# Patient Record
Sex: Male | Born: 1955
Health system: Southern US, Community
[De-identification: ages and names within clinical notes are randomized; demographics above are authoritative.]

## PROBLEM LIST (undated history)

## (undated) DIAGNOSIS — M1990 Unspecified osteoarthritis, unspecified site: Secondary | ICD-10-CM

## (undated) DIAGNOSIS — J449 Chronic obstructive pulmonary disease, unspecified: Secondary | ICD-10-CM

## (undated) DIAGNOSIS — T7840XA Allergy, unspecified, initial encounter: Secondary | ICD-10-CM

## (undated) HISTORY — PX: HERNIA REPAIR: SHX51

## (undated) HISTORY — DX: Unspecified osteoarthritis, unspecified site: M19.90

## (undated) HISTORY — PX: TONSILLECTOMY: SUR1361

## (undated) HISTORY — DX: Chronic obstructive pulmonary disease, unspecified: J44.9

## (undated) HISTORY — DX: Allergy, unspecified, initial encounter: T78.40XA

## (undated) HISTORY — PX: OTHER SURGICAL HISTORY: SHX169

## (undated) HISTORY — PX: SHOULDER SURGERY: SHX246

---

## 1998-03-31 ENCOUNTER — Ambulatory Visit (HOSPITAL_BASED_OUTPATIENT_CLINIC_OR_DEPARTMENT_OTHER): Admission: RE | Admit: 1998-03-31 | Discharge: 1998-03-31 | Payer: Self-pay | Admitting: Surgery

## 2005-12-27 ENCOUNTER — Ambulatory Visit: Payer: Self-pay | Admitting: Internal Medicine

## 2006-02-17 ENCOUNTER — Ambulatory Visit: Payer: Self-pay | Admitting: Internal Medicine

## 2006-04-04 ENCOUNTER — Ambulatory Visit: Payer: Self-pay | Admitting: Internal Medicine

## 2006-04-29 ENCOUNTER — Ambulatory Visit: Payer: Self-pay | Admitting: Gastroenterology

## 2006-05-13 ENCOUNTER — Ambulatory Visit: Payer: Self-pay | Admitting: Internal Medicine

## 2006-05-23 ENCOUNTER — Ambulatory Visit: Payer: Self-pay | Admitting: Internal Medicine

## 2006-06-09 ENCOUNTER — Encounter (INDEPENDENT_AMBULATORY_CARE_PROVIDER_SITE_OTHER): Payer: Self-pay | Admitting: Specialist

## 2006-06-09 ENCOUNTER — Ambulatory Visit: Payer: Self-pay | Admitting: Gastroenterology

## 2006-08-02 ENCOUNTER — Ambulatory Visit: Payer: Self-pay | Admitting: Internal Medicine

## 2007-08-01 ENCOUNTER — Telehealth: Payer: Self-pay | Admitting: Internal Medicine

## 2007-08-07 ENCOUNTER — Ambulatory Visit: Payer: Self-pay | Admitting: Internal Medicine

## 2007-08-07 DIAGNOSIS — R042 Hemoptysis: Secondary | ICD-10-CM | POA: Insufficient documentation

## 2007-08-07 DIAGNOSIS — J441 Chronic obstructive pulmonary disease with (acute) exacerbation: Secondary | ICD-10-CM

## 2007-08-07 DIAGNOSIS — R5381 Other malaise: Secondary | ICD-10-CM

## 2007-08-07 DIAGNOSIS — R5383 Other fatigue: Secondary | ICD-10-CM

## 2007-08-07 DIAGNOSIS — J309 Allergic rhinitis, unspecified: Secondary | ICD-10-CM | POA: Insufficient documentation

## 2007-08-07 LAB — CONVERTED CEMR LAB
Basophils Absolute: 0 10*3/uL (ref 0.0–0.1)
CO2: 26 meq/L (ref 19–32)
Creatinine, Ser: 1.3 mg/dL (ref 0.4–1.5)
GFR calc Af Amer: 75 mL/min
GFR calc non Af Amer: 62 mL/min
Glucose, Bld: 89 mg/dL (ref 70–99)
Hemoglobin: 15.5 g/dL (ref 13.0–17.0)
MCV: 91 fL (ref 78.0–100.0)
Monocytes Absolute: 0.8 10*3/uL — ABNORMAL HIGH (ref 0.2–0.7)
Neutro Abs: 6.7 10*3/uL (ref 1.4–7.7)
Platelets: 234 10*3/uL (ref 150–400)
RBC: 4.92 M/uL (ref 4.22–5.81)
RDW: 12.6 % (ref 11.5–14.6)

## 2007-08-09 ENCOUNTER — Ambulatory Visit: Payer: Self-pay | Admitting: Cardiovascular Disease

## 2007-08-14 ENCOUNTER — Telehealth: Payer: Self-pay | Admitting: Internal Medicine

## 2007-08-15 ENCOUNTER — Telehealth: Payer: Self-pay | Admitting: Internal Medicine

## 2007-08-16 ENCOUNTER — Encounter: Payer: Self-pay | Admitting: Internal Medicine

## 2007-08-21 ENCOUNTER — Ambulatory Visit: Payer: Self-pay | Admitting: Internal Medicine

## 2007-08-21 DIAGNOSIS — F329 Major depressive disorder, single episode, unspecified: Secondary | ICD-10-CM

## 2007-08-22 ENCOUNTER — Telehealth: Payer: Self-pay | Admitting: Internal Medicine

## 2007-08-30 ENCOUNTER — Encounter: Payer: Self-pay | Admitting: Internal Medicine

## 2008-01-03 ENCOUNTER — Telehealth: Payer: Self-pay | Admitting: Internal Medicine

## 2008-01-09 ENCOUNTER — Ambulatory Visit: Payer: Self-pay | Admitting: Internal Medicine

## 2008-01-09 DIAGNOSIS — N410 Acute prostatitis: Secondary | ICD-10-CM

## 2008-01-10 ENCOUNTER — Telehealth: Payer: Self-pay | Admitting: Internal Medicine

## 2008-01-29 ENCOUNTER — Telehealth: Payer: Self-pay | Admitting: Internal Medicine

## 2008-01-29 ENCOUNTER — Ambulatory Visit: Payer: Self-pay | Admitting: Internal Medicine

## 2008-07-25 ENCOUNTER — Telehealth: Payer: Self-pay | Admitting: Internal Medicine

## 2008-07-29 ENCOUNTER — Ambulatory Visit: Payer: Self-pay | Admitting: Internal Medicine

## 2008-07-29 DIAGNOSIS — B353 Tinea pedis: Secondary | ICD-10-CM | POA: Insufficient documentation

## 2008-07-29 LAB — CONVERTED CEMR LAB
Alkaline Phosphatase: 72 units/L (ref 39–117)
Blood in Urine, dipstick: NEGATIVE
CO2: 28 meq/L (ref 19–32)
Calcium: 9.8 mg/dL (ref 8.4–10.5)
Creatinine, Ser: 1.1 mg/dL (ref 0.4–1.5)
Eosinophils Relative: 0.9 % (ref 0.0–5.0)
GFR calc Af Amer: 90 mL/min
Glucose, Urine, Semiquant: NEGATIVE
HDL: 65.2 mg/dL (ref >39.0)
Hemoglobin: 15.2 g/dL (ref 13.0–17.0)
Lymphocytes Relative: 19.5 % (ref 12.0–46.0)
Neutro Abs: 3.3 10*3/uL (ref 1.4–7.7)
Potassium: 4.4 meq/L (ref 3.5–5.1)
RBC: 4.91 M/uL (ref 4.22–5.81)
Sodium: 144 meq/L (ref 135–145)
Specific Gravity, Urine: 1.02
TSH: 0.76 microintl units/mL (ref 0.35–5.50)
Total CHOL/HDL Ratio: 2.9
Triglycerides: 76 mg/dL (ref 0–149)
Urobilinogen, UA: 0.2
VLDL: 15 mg/dL (ref 0–40)
WBC Urine, dipstick: NEGATIVE
pH: 5.5

## 2008-07-31 ENCOUNTER — Ambulatory Visit: Payer: Self-pay | Admitting: Internal Medicine

## 2008-07-31 DIAGNOSIS — R197 Diarrhea, unspecified: Secondary | ICD-10-CM

## 2008-07-31 DIAGNOSIS — R1013 Epigastric pain: Secondary | ICD-10-CM

## 2008-08-14 ENCOUNTER — Telehealth: Payer: Self-pay | Admitting: Internal Medicine

## 2009-03-14 ENCOUNTER — Telehealth (INDEPENDENT_AMBULATORY_CARE_PROVIDER_SITE_OTHER): Payer: Self-pay | Admitting: *Deleted

## 2010-07-31 ENCOUNTER — Ambulatory Visit: Payer: Self-pay | Admitting: Family Medicine

## 2010-07-31 DIAGNOSIS — H6120 Impacted cerumen, unspecified ear: Secondary | ICD-10-CM

## 2010-08-03 ENCOUNTER — Ambulatory Visit: Payer: Self-pay | Admitting: Internal Medicine

## 2010-08-03 LAB — CONVERTED CEMR LAB
AST: 20 units/L (ref 0–37)
Alkaline Phosphatase: 72 units/L (ref 39–117)
BUN: 25 mg/dL — ABNORMAL HIGH (ref 6–23)
Basophils Absolute: 0 10*3/uL (ref 0.0–0.1)
Basophils Relative: 0.7 % (ref 0.0–3.0)
CO2: 27 meq/L (ref 19–32)
Calcium: 9.4 mg/dL (ref 8.4–10.5)
Cholesterol: 223 mg/dL — ABNORMAL HIGH (ref 0–200)
Creatinine, Ser: 1.1 mg/dL (ref 0.4–1.5)
GFR calc non Af Amer: 71.1 mL/min (ref >60.00)
HCT: 42.4 % (ref 39.0–52.0)
HDL: 73.8 mg/dL (ref >39.00)
Ketones, urine, test strip: NEGATIVE
MCHC: 34.5 g/dL (ref 30.0–36.0)
MCV: 91.5 fL (ref 78.0–100.0)
Neutro Abs: 4.4 10*3/uL (ref 1.4–7.7)
Neutrophils Relative %: 63.3 % (ref 43.0–77.0)
Nitrite: NEGATIVE
Platelets: 216 10*3/uL (ref 150.0–400.0)
Protein, U semiquant: NEGATIVE
RDW: 12.9 % (ref 11.5–14.6)
Sodium: 141 meq/L (ref 135–145)
Total CHOL/HDL Ratio: 3
Urobilinogen, UA: 0.2
VLDL: 13.6 mg/dL (ref 0.0–40.0)
WBC Urine, dipstick: NEGATIVE
pH: 5.5

## 2010-08-12 ENCOUNTER — Ambulatory Visit: Payer: Self-pay | Admitting: Internal Medicine

## 2010-08-12 DIAGNOSIS — B351 Tinea unguium: Secondary | ICD-10-CM

## 2010-09-13 ENCOUNTER — Encounter: Payer: Self-pay | Admitting: Internal Medicine

## 2010-09-24 NOTE — Assessment & Plan Note (Signed)
Summary: cpx/bmw   Vital Signs:  Patient profile:   55 year old male Height:      73 inches Weight:      197 pounds BMI:     26.08 Temp:     98.2 degrees F oral Pulse rate:   80 / minute Resp:     14 per minute BP sitting:   120 / 70  (left arm)  Vitals Entered By: Willy Eddy, LPN (August 12, 2010 4:55 PM) CC: cpx Is Patient Diabetic? No   Primary Care Provider:  Stacie Glaze MD  CC:  cpx.  History of Present Illness: The pt was asked about all immunizations, health maint. services that are appropriate to their age and was given guidance on diet exercize  and weight management  has noted increased dicolorations and thickening of his nails with a prior hx of fungal nail infections no risk of DM or autoimmune issues  Preventive Screening-Counseling & Management  Alcohol-Tobacco     Smoking Status: quit < 6 months     Year Started: 1991     Year Quit: 2011--september     Passive Smoke Exposure: no     Tobacco Counseling: to remain off tobacco products  Problems Prior to Update: 1)  Dermatophytosis of Nail  (ICD-110.1) 2)  Impacted Cerumen  (ICD-380.4) 3)  Abdominal Pain, Epigastric  (ICD-789.06) 4)  Diarrhea  (ICD-787.91) 5)  Dermatophytosis of Foot  (ICD-110.4) 6)  Preventive Health Care  (ICD-V70.0) 7)  Acute Prostatitis  (ICD-601.0) 8)  Depressive Disorder Not Elsewhere Classified  (ICD-311) 9)  Hemoptysis  (ICD-786.3) 10)  Chronic Obstructive Pulmonary Disease, Acute Exacerbation  (ICD-491.21) 11)  Malaise and Fatigue  (ICD-780.79) 12)  Allergic Rhinitis  (ICD-477.9)  Medications Prior to Update: 1)  Wellbutrin Sr 150 Mg  Tb12 (Bupropion Hcl) .Marland Kitchen.. 1 Two Times A Day Hold 2)  Cialis 5 Mg Tabs (Tadalafil) .... One By Mouth Daily  Current Medications (verified): 1)  Wellbutrin Sr 150 Mg  Tb12 (Bupropion Hcl) .Marland Kitchen.. 1 Two Times A Day Hold 2)  Cialis 5 Mg Tabs (Tadalafil) .... One By Mouth Daily 3)  Terbinafine Hcl 250 Mg Tabs (Terbinafine Hcl) .... One  By Mouth Daily For 90 Days  Allergies (verified): No Known Drug Allergies  Past History:  Family History: Last updated: 08/07/2007 father has cardiomyopathy, colon cancer and lung cancer mother hyperthyroidism.  Social History: Last updated: 08/07/2007 Married Current Smoker  Risk Factors: Smoking Status: quit < 6 months (08/12/2010) Passive Smoke Exposure: no (08/12/2010)  Past medical, surgical, family and social histories (including risk factors) reviewed, and no changes noted (except as noted below).  Past Medical History: Reviewed history from 08/07/2007 and no changes required. Allergic rhinitis  Past Surgical History: Reviewed history from 08/07/2007 and no changes required. knee reconstruction on right knee Inguinal herniorrhaphy  Family History: Reviewed history from 08/07/2007 and no changes required. father has cardiomyopathy, colon cancer and lung cancer mother hyperthyroidism.  Social History: Reviewed history from 08/07/2007 and no changes required. Married Current Smoker Smoking Status:  quit < 6 months  Review of Systems  The patient denies anorexia, fever, weight loss, weight gain, vision loss, decreased hearing, hoarseness, chest pain, syncope, dyspnea on exertion, peripheral edema, prolonged cough, headaches, hemoptysis, abdominal pain, melena, hematochezia, severe indigestion/heartburn, hematuria, incontinence, genital sores, muscle weakness, suspicious skin lesions, transient blindness, difficulty walking, depression, unusual weight change, abnormal bleeding, enlarged lymph nodes, angioedema, breast masses, and testicular masses.    Physical Exam  General:  Well-developed,well-nourished,in no acute distress; alert,appropriate and cooperative throughout examination Head:  normocephalic and no abnormalities palpated.   Eyes:  pupils equal and pupils round.   Ears:  bilateral cerumen impactionsR ear normal and L ear normal.   Nose:  no external  deformity and no nasal discharge.   Mouth:  good dentition and pharynx pink and moist.   Neck:  No deformities, masses, or tenderness noted. Chest Wall:  No deformities, masses, tenderness or gynecomastia noted. Lungs:  normal respiratory effort and no wheezes.   Heart:  normal rate and regular rhythm.   Abdomen:  soft and normal bowel sounds.   Genitalia:  circumcised and no urethral discharge.   Prostate:  no nodules and no asymmetry.   Msk:  decreased ROM and joint tenderness.   Pulses:  R and L carotid,radial,femoral,dorsalis pedis and posterior tibial pulses are full and equal bilaterally Extremities:  No clubbing, cyanosis, edema, or deformity noted with normal full range of motion of all joints.   Neurologic:  No cranial nerve deficits noted. Station and gait are normal. Plantar reflexes are down-going bilaterally. DTRs are symmetrical throughout. Sensory, motor and coordinative functions appear intact. Skin:  turgor normal and no edema.   Cervical Nodes:  No lymphadenopathy noted Axillary Nodes:  No palpable lymphadenopathy Psych:  Cognition and judgment appear intact. Alert and cooperative with normal attention span and concentration. No apparent delusions, illusions, hallucinations   Impression & Recommendations:  Problem # 1:  DERMATOPHYTOSIS OF NAIL (ICD-110.1)  Discussed nail care and medication treatment options.   His updated medication list for this problem includes:    Terbinafine Hcl 250 Mg Tabs (Terbinafine hcl) ..... One by mouth daily for 90 days  Problem # 2:  PREVENTIVE HEALTH CARE (ICD-V70.0) The pt was asked about all immunizations, health maint. services that are appropriate to their age and was given guidance on diet exercize  and weight management  Colonoscopy: Adenomatous Polyp (06/09/2006) Td Booster: Historical (07/26/2003)   Chol: 223 (08/03/2010)   HDL: 73.80 (08/03/2010)   LDL: 112 (07/29/2008)   TG: 68.0 (08/03/2010) TSH: 1.27 (08/03/2010)   PSA:  1.73 (08/03/2010) Next Colonoscopy due:: 06/2011 (07/29/2008)  Discussed using sunscreen, use of alcohol, drug use, self testicular exam, routine dental care, routine eye care, routine physical exam, seat belts, multiple vitamins, osteoporosis prevention, adequate calcium intake in diet, and recommendations for immunizations.  Discussed exercise and checking cholesterol.  Discussed gun safety, safe sex, and contraception. Also recommend checking PSA.  Complete Medication List: 1)  Wellbutrin Sr 150 Mg Tb12 (Bupropion hcl) .Marland Kitchen.. 1 two times a day hold 2)  Cialis 5 Mg Tabs (Tadalafil) .... One by mouth daily 3)  Terbinafine Hcl 250 Mg Tabs (Terbinafine hcl) .... One by mouth daily for 90 days  Patient Instructions: 1)  Please schedule a follow-up appointment in 3 months. 2)  Hepatic Panel prior to visit, ICD-9:995.20 Prescriptions: TERBINAFINE HCL 250 MG TABS (TERBINAFINE HCL) one by mouth daily for 90 days  #90 x 0   Entered and Authorized by:   Stacie Glaze MD   Signed by:   Stacie Glaze MD on 08/12/2010   Method used:   Electronically to        [facility] [address] (retail)       [address]       [place], [place]  [address]       Ph: [phone]       Fax: [phone]   RxID:  [id number]    Orders Added: 1)  Est. Patient 40-64 years [99396] 2)  Est. Patient Level II [78469]

## 2010-09-24 NOTE — Assessment & Plan Note (Signed)
Summary: ear pain/njr   Vital Signs:  Patient profile:   55 year old male Weight:      196 pounds O2 Sat:      97 % Temp:     98.3 degrees F Pulse rate:   86 / minute BP sitting:   120 / 82  (left arm)  Vitals Entered By: Pura Spice, RN (July 31, 2010 9:57 AM) CC: rt ear ache pressure been flying alot    History of Present Illness: Right ear pain for the past several days.  Flies frequently and symptoms seem worse with increased altitude. Prior history of cerumen impactions. No recent nasal congestion symptoms. Denies any dizziness, fever, or chills. No sore throat.  Allergies: No Known Drug Allergies  Past History:  Past Medical History: Last updated: 08/07/2007 Allergic rhinitis PMH reviewed for relevance  Review of Systems  The patient denies anorexia, fever, weight loss, decreased hearing, hoarseness, prolonged cough, and headaches.    Physical Exam  General:  Well-developed,well-nourished,in no acute distress; alert,appropriate and cooperative throughout examination Ears:  bilateral cerumen impactions Mouth:  Oral mucosa and oropharynx without lesions or exudates.  Teeth in good repair. Neck:  No deformities, masses, or tenderness noted. Lungs:  Normal respiratory effort, chest expands symmetrically. Lungs are clear to auscultation, no crackles or wheezes. Heart:  normal rate and regular rhythm.     Impression & Recommendations:  Problem # 1:  IMPACTED CERUMEN (ICD-380.4) removed with irrigation without difficulty.  Symptoms improved.  Complete Medication List: 1)  Wellbutrin Sr 150 Mg Tb12 (Bupropion hcl) .Marland Kitchen.. 1 two times a day hold 2)  Cialis 5 Mg Tabs (Tadalafil) .... One by mouth daily   Orders Added: 1)  Est. Patient Level III [54098]

## 2011-02-06 ENCOUNTER — Other Ambulatory Visit: Payer: Self-pay | Admitting: Internal Medicine

## 2011-02-11 ENCOUNTER — Telehealth: Payer: Self-pay | Admitting: *Deleted

## 2011-02-11 NOTE — Telephone Encounter (Signed)
Pt. Would like to have Wellbutrin prescribed again as he has resumed smoking.

## 2011-02-12 MED ORDER — BUPROPION HCL ER (XL) 150 MG PO TB24: 150.0000 mg | ORAL_TABLET | ORAL | Status: DC

## 2011-02-12 NOTE — Telephone Encounter (Signed)
Notified pt. 

## 2011-02-12 NOTE — Telephone Encounter (Signed)
Per dr Yvonne Kendall have generic wellbutrin xl-24 hr 150 qd #30 with 3 refills

## 2011-04-03 ENCOUNTER — Other Ambulatory Visit: Payer: Self-pay | Admitting: Internal Medicine

## 2011-05-26 ENCOUNTER — Other Ambulatory Visit: Payer: Self-pay | Admitting: Internal Medicine

## 2011-07-02 ENCOUNTER — Encounter: Payer: Self-pay | Admitting: Gastroenterology

## 2011-07-20 ENCOUNTER — Other Ambulatory Visit (INDEPENDENT_AMBULATORY_CARE_PROVIDER_SITE_OTHER): Payer: BC Managed Care – PPO

## 2011-07-20 DIAGNOSIS — Z0000 Encounter for general adult medical examination without abnormal findings: Secondary | ICD-10-CM

## 2011-07-20 LAB — BASIC METABOLIC PANEL
BUN: 20 mg/dL (ref 6–23)
CO2: 27 mEq/L (ref 19–32)
Chloride: 108 mEq/L (ref 96–112)
Creatinine, Ser: 1.1 mg/dL (ref 0.4–1.5)

## 2011-07-20 LAB — CBC WITH DIFFERENTIAL/PLATELET
Basophils Relative: 0.2 % (ref 0.0–3.0)
Eosinophils Absolute: 0.3 10*3/uL (ref 0.0–0.7)
Lymphs Abs: 1.6 10*3/uL (ref 0.7–4.0)
MCHC: 33 g/dL (ref 30.0–36.0)
MCV: 94.1 fl (ref 78.0–100.0)
Monocytes Absolute: 0.7 10*3/uL (ref 0.1–1.0)
Neutrophils Relative %: 72.7 % (ref 43.0–77.0)
Platelets: 228 10*3/uL (ref 150.0–400.0)
RBC: 5.04 Mil/uL (ref 4.22–5.81)

## 2011-07-20 LAB — POCT URINALYSIS DIPSTICK
Blood, UA: NEGATIVE
Ketones, UA: NEGATIVE
Protein, UA: NEGATIVE
Spec Grav, UA: 1.025
pH, UA: 5.5

## 2011-07-20 LAB — HEPATIC FUNCTION PANEL
Albumin: 4.1 g/dL (ref 3.5–5.2)
Bilirubin, Direct: 0.1 mg/dL (ref 0.0–0.3)
Total Protein: 6.6 g/dL (ref 6.0–8.3)

## 2011-07-20 LAB — LIPID PANEL
Cholesterol: 248 mg/dL — ABNORMAL HIGH (ref 0–200)
HDL: 94.8 mg/dL (ref >39.00)
Triglycerides: 58 mg/dL (ref 0.0–149.0)

## 2011-07-20 LAB — LDL CHOLESTEROL, DIRECT: Direct LDL: 136.3 mg/dL

## 2011-08-02 ENCOUNTER — Ambulatory Visit (INDEPENDENT_AMBULATORY_CARE_PROVIDER_SITE_OTHER): Payer: BC Managed Care – PPO | Admitting: Internal Medicine

## 2011-08-02 ENCOUNTER — Encounter: Payer: Self-pay | Admitting: Internal Medicine

## 2011-08-02 VITALS — BP 120/70 | HR 77 | Temp 98.2°F | Resp 16 | Ht 72.0 in | Wt 191.0 lb

## 2011-08-02 DIAGNOSIS — E875 Hyperkalemia: Secondary | ICD-10-CM

## 2011-08-02 DIAGNOSIS — Z0000 Encounter for general adult medical examination without abnormal findings: Secondary | ICD-10-CM

## 2011-08-02 DIAGNOSIS — R972 Elevated prostate specific antigen [PSA]: Secondary | ICD-10-CM

## 2011-08-02 MED ORDER — BUPROPION HCL ER (XL) 300 MG PO TB24: 300.0000 mg | ORAL_TABLET | ORAL | Status: DC

## 2011-08-02 MED ORDER — BUPROPION HCL ER (XL) 150 MG PO TB24: 300.0000 mg | ORAL_TABLET | ORAL | Status: DC

## 2011-08-02 NOTE — Progress Notes (Signed)
Subjective:    Patient ID: Miguel Benjamin, male    DOB: [birth year], 55 y.o.   MRN: [id number]  HPI CPX Patient generally feels well and has been followed for only minimal problems including mild chronic obstructive pulmonary disease due to smoking history And fungal nail disease.  He has had periodic abdominal epigastric discomfort. He is on Wellbutrin 300 mg by mouth daily for anxiety depression   Review of Systems  Constitutional: Negative for fever and fatigue.  HENT: Negative for hearing loss, congestion, neck pain and postnasal drip.   Eyes: Negative for discharge, redness and visual disturbance.  Respiratory: Negative for cough, shortness of breath and wheezing.   Cardiovascular: Negative for leg swelling.  Gastrointestinal: Negative for abdominal pain, constipation and abdominal distention.  Genitourinary: Negative for urgency and frequency.  Musculoskeletal: Negative for joint swelling and arthralgias.  Skin: Negative for color change and rash.  Neurological: Negative for weakness and light-headedness.  Hematological: Negative for adenopathy.  Psychiatric/Behavioral: Negative for behavioral problems.   Past Medical History  Diagnosis Date  . Allergy     History   Social History  . Marital Status: Married    Spouse Name: N/A    Number of Children: N/A  . Years of Education: N/A   Occupational History  . Not on file.   Social History Main Topics  . Smoking status: Current Everyday Smoker  . Smokeless tobacco: Not on file  . Alcohol Use: Not on file  . Drug Use: Not on file  . Sexually Active: Not on file   Other Topics Concern  . Not on file   Social History Narrative  . No narrative on file    Past Surgical History  Procedure Date  . Knee reconstruction on right knee   . Hernia repair     Family History  Problem Relation Age of Onset  . Hyperthyroidism Mother   . Colon cancer Father   . Lung cancer Father   . Cardiomyopathy Father     No  Known Allergies  Current Outpatient Prescriptions on File Prior to Visit  Medication Sig Dispense Refill  . CIALIS 5 MG tablet TAKE 1 TABLET BY MOUTH EVERY DAY  30 tablet  0    BP 120/70  Pulse 77  Temp 98.2 F (36.8 C)  Resp 16  Ht 6' (1.829 m)  Wt 191 lb (86.637 kg)  BMI 25.90 kg/m2       Objective:   Physical Exam  Nursing note and vitals reviewed. Constitutional: He is oriented to person, place, and time. He appears well-developed and well-nourished.  HENT:  Head: Normocephalic and atraumatic.  Eyes: Conjunctivae are normal. Pupils are equal, round, and reactive to light.  Neck: Normal range of motion. Neck supple.  Cardiovascular: Normal rate and regular rhythm.   Pulmonary/Chest: Effort normal and breath sounds normal.  Abdominal: Soft. Bowel sounds are normal.  Genitourinary: Rectum normal and prostate normal.  Musculoskeletal: Normal range of motion.  Neurological: He is alert and oriented to person, place, and time.  Skin: Skin is warm and dry.  Psychiatric: He has a normal mood and affect. His behavior is normal.          Assessment & Plan:   Patient presents for yearly preventative medicine examination.   all immunizations and health maintenance protocols were reviewed with the patient and they are up to date with these protocols.   screening laboratory values were reviewed with the patient including screening of hyperlipidemia PSA renal function  and hepatic function.   There medications past medical history social history problem list and allergies were reviewed in detail. Has fungal toes we discussed the use of vinegar soaks and gave him instructions on how to prepare those.  He has acute prostatitis with elevation of his PSA we will treat him with an antibiotic course and repeat the PSA to ensure that there is no increased velocity.  If the PSA continues to increase a referral for urology and consideration for biopsy would be the next step. Smoking  cessation and counseling given   Goals were established with regard to weight loss exercise diet in compliance with medications

## 2011-08-02 NOTE — Patient Instructions (Signed)
The patient is instructed to continue all medications as prescribed. Schedule followup with check out clerk upon leaving the clinic  

## 2011-08-03 LAB — POTASSIUM: Potassium: 4.4 mEq/L (ref 3.5–5.1)

## 2011-08-03 LAB — PSA, TOTAL AND FREE
PSA, Free Pct: 36 % (ref >25)
PSA: 2.22 ng/mL (ref <=4.00)

## 2011-08-18 ENCOUNTER — Encounter: Payer: Self-pay | Admitting: Internal Medicine

## 2011-08-30 ENCOUNTER — Encounter: Payer: Self-pay | Admitting: Internal Medicine

## 2011-08-30 ENCOUNTER — Ambulatory Visit (INDEPENDENT_AMBULATORY_CARE_PROVIDER_SITE_OTHER): Payer: BC Managed Care – PPO | Admitting: Internal Medicine

## 2011-08-30 ENCOUNTER — Telehealth: Payer: Self-pay | Admitting: *Deleted

## 2011-08-30 ENCOUNTER — Ambulatory Visit
Admission: RE | Admit: 2011-08-30 | Discharge: 2011-08-30 | Disposition: A | Discharge status: Home / Self Care | Payer: BC Managed Care – PPO | Source: Ambulatory Visit | Attending: Internal Medicine | Admitting: Internal Medicine

## 2011-08-30 VITALS — BP 132/84 | Temp 98.1°F | Wt 188.0 lb

## 2011-08-30 DIAGNOSIS — R1031 Right lower quadrant pain: Secondary | ICD-10-CM

## 2011-08-30 LAB — CBC WITH DIFFERENTIAL/PLATELET
Basophils Absolute: 0 10*3/uL (ref 0.0–0.1)
Lymphocytes Relative: 21.6 % (ref 12.0–46.0)
Monocytes Relative: 14.1 % — ABNORMAL HIGH (ref 3.0–12.0)
Platelets: 192 10*3/uL (ref 150.0–400.0)
RDW: 13.4 % (ref 11.5–14.6)

## 2011-08-30 LAB — BASIC METABOLIC PANEL
CO2: 27 mEq/L (ref 19–32)
Calcium: 9.2 mg/dL (ref 8.4–10.5)
Chloride: 105 mEq/L (ref 96–112)
Sodium: 143 mEq/L (ref 135–145)

## 2011-08-30 MED ORDER — AMOXICILLIN-POT CLAVULANATE 875-125 MG PO TABS: 1.0000 | ORAL_TABLET | Freq: Two times a day (BID) | ORAL | Status: DC

## 2011-08-30 MED ORDER — HYDROCODONE-ACETAMINOPHEN 5-500 MG PO TABS: 1.0000 | ORAL_TABLET | Freq: Three times a day (TID) | ORAL | Status: DC | PRN

## 2011-08-30 MED ORDER — IOHEXOL 300 MG/ML  SOLN
100.0000 mL | Freq: Once | INTRAMUSCULAR | Status: AC | PRN
  Administered 2011-08-30: 100 mL via INTRAVENOUS

## 2011-08-30 NOTE — Progress Notes (Signed)
  Subjective:    Patient ID: Miguel Benjamin, male    DOB: Dec 06, 1955, 56 y.o.   MRN: [id number]  HPI 56 year old white male complains of severe right lower quadrant/flank pain for the last 24-48 hours. His symptoms started out as mild yesterday.  When he got up this morning and coughed,  he experienced excruciating right lower quadrant pain. He denies any urinary symptoms and he denies shortness of breath. He has been traveling for business as usual. He did not feel well while he was in [place].  He had a "spell" for 10-15 minutes.  Patient also reports experiencing unexplained night sweats for the last 56 weeks.   Review of Systems  Negative for dysuria or hematuria.  He has chronic intermittent cough.  He is long standing smoker - >30 yrs    Past Medical History  Diagnosis Date  . Allergy     History   Social History  . Marital Status: Married    Spouse Name: N/A    Number of Children: N/A  . Years of Education: N/A   Occupational History  . Not on file.   Social History Main Topics  . Smoking status: Current Everyday Smoker  . Smokeless tobacco: Not on file  . Alcohol Use: Not on file  . Drug Use: Not on file  . Sexually Active: Not on file   Other Topics Concern  . Not on file   Social History Narrative  . No narrative on file    Past Surgical History  Procedure Date  . Knee reconstruction on right knee   . Hernia repair     Family History  Problem Relation Age of Onset  . Hyperthyroidism Mother   . Colon cancer Father   . Lung cancer Father   . Cardiomyopathy Father     No Known Allergies  Current Outpatient Prescriptions on File Prior to Visit  Medication Sig Dispense Refill  . buPROPion (WELLBUTRIN XL) 300 MG 24 hr tablet Take 1 tablet (300 mg total) by mouth every morning.  30 tablet  2    BP 132/84  Temp(Src) 98.1 F (36.7 C) (Oral)  Wt 188 lb (85.276 kg)    Objective:   Physical Exam  Constitutional: He is oriented to person, place,  and time. He appears well-developed and well-nourished.  HENT:  Head: Normocephalic and atraumatic.  Cardiovascular: Normal rate, regular rhythm and normal heart sounds.   Pulmonary/Chest:       Prolonged expiration,  Scattered faint expiratory wheeze  Abdominal: Soft. Bowel sounds are normal.       Significant right flank and RLQ tenderness,  No rebound,  No guarding  Neurological: He is alert and oriented to person, place, and time.  Skin: Skin is warm and dry.  Psychiatric: He has a normal mood and affect. His behavior is normal.       Assessment & Plan:

## 2011-08-30 NOTE — Telephone Encounter (Signed)
Pt calls stating he has extreme pain on right side under ribs that is unbearable when he coughs.  He is concerned about his GB.  Pt had chills this am. No fever, No injury. No N, V, diarrhea.  Reproduces pain by palpation.  No appetite.   Pain started yesterday.

## 2011-08-30 NOTE — Telephone Encounter (Signed)
Per Dr. Lovell Sheehan, work pt in with a MD that has an opening today.

## 2011-08-30 NOTE — Patient Instructions (Signed)
Our office will contact you re:  Blood test and CT of abdomin and pelvis results.

## 2011-08-30 NOTE — Assessment & Plan Note (Addendum)
56 year old male presents with acute right lower abdominal pain.  His symptoms are severe with coughing. He has significant right lower quadrant/flank tenderness. Considering his history of night sweats x2 weeks I am concerned of possible smoldering abdominal infection/abscess. Obtain stat CBCD, BMET and CT of abdomen and pelvis.  Addendum - CT of abd and pelvis suggestive of early appendicitis.  Pt advised to start augmentin 875/125 mg tonight.  He has been taking amoxicillin for dental issue.  His white count and sed rate are normal.  Pt advised to follow up with Dr. Lovell Sheehan within 24 - 48 hrs.  He understands to proceed to ER if his symptoms worsen.

## 2011-08-30 NOTE — Progress Notes (Signed)
Addended by: Simeon Craft on: 08/30/2011 07:14 PM   Modules accepted: Orders

## 2011-08-31 ENCOUNTER — Emergency Department (HOSPITAL_COMMUNITY)
Admission: EM | Admit: 2011-08-31 | Discharge: 2011-08-31 | Disposition: A | Discharge status: Home / Self Care | Payer: BC Managed Care – PPO | Attending: Emergency Medicine | Admitting: Emergency Medicine

## 2011-08-31 ENCOUNTER — Encounter (HOSPITAL_COMMUNITY): Payer: Self-pay | Admitting: *Deleted

## 2011-08-31 ENCOUNTER — Telehealth (INDEPENDENT_AMBULATORY_CARE_PROVIDER_SITE_OTHER): Payer: Self-pay

## 2011-08-31 ENCOUNTER — Telehealth: Payer: Self-pay | Admitting: Family Medicine

## 2011-08-31 DIAGNOSIS — R109 Unspecified abdominal pain: Secondary | ICD-10-CM | POA: Insufficient documentation

## 2011-08-31 DIAGNOSIS — R110 Nausea: Secondary | ICD-10-CM | POA: Insufficient documentation

## 2011-08-31 DIAGNOSIS — M1710 Unilateral primary osteoarthritis, unspecified knee: Secondary | ICD-10-CM

## 2011-08-31 DIAGNOSIS — R10819 Abdominal tenderness, unspecified site: Secondary | ICD-10-CM | POA: Insufficient documentation

## 2011-08-31 DIAGNOSIS — F17200 Nicotine dependence, unspecified, uncomplicated: Secondary | ICD-10-CM | POA: Insufficient documentation

## 2011-08-31 LAB — DIFFERENTIAL
Basophils Relative: 1 % (ref 0–1)
Eosinophils Absolute: 0 10*3/uL (ref 0.0–0.7)
Eosinophils Relative: 1 % (ref 0–5)
Lymphs Abs: 1.1 10*3/uL (ref 0.7–4.0)
Neutrophils Relative %: 61 % (ref 43–77)

## 2011-08-31 LAB — COMPREHENSIVE METABOLIC PANEL
ALT: 20 U/L (ref 0–53)
Albumin: 3.8 g/dL (ref 3.5–5.2)
BUN: 17 mg/dL (ref 6–23)
Calcium: 8.9 mg/dL (ref 8.4–10.5)
GFR calc Af Amer: >90 mL/min (ref >90)
Glucose, Bld: 98 mg/dL (ref 70–99)
Sodium: 137 mEq/L (ref 135–145)
Total Protein: 6.7 g/dL (ref 6.0–8.3)

## 2011-08-31 LAB — CBC
MCH: 30.3 pg (ref 26.0–34.0)
MCHC: 34.5 g/dL (ref 30.0–36.0)
MCV: 87.9 fL (ref 78.0–100.0)
Platelets: 225 10*3/uL (ref 150–400)
RBC: 5.05 MIL/uL (ref 4.22–5.81)

## 2011-08-31 NOTE — ED Notes (Signed)
Pt given discharge info and verb understanding.

## 2011-08-31 NOTE — Telephone Encounter (Signed)
Office Message from Date: 08/30/2011 12:00:00 AM Time of Call: [phone] Faxed To: [facility] CallerSharl Ma Fax Number: [phone] Facility: N/A Patient: Miguel Benjamin, Miguel Benjamin DOB: 04/13/1956 Phone: [phone] Provider: Thomos Lemons Message: Sharl Ma is calling with results for CT ordered by Thomos Lemons. The results are Abnormal on CT Abdomen and pelvis and were read by Dr. Mayford Knife. Regarding Appointment: Appt Date: Appt Time: Unknown Provider: Thomos Lemons Reason: Details: Outcome: Message Taken by: Katherine Mantle, CSR [facility]  [address] [place], [place] [address]  P: [phone]  F: [phone]

## 2011-08-31 NOTE — ED Notes (Signed)
WUJ:[id number]<JY> Expected date:08/31/11<BR> Expected time:12:18 PM<BR> Means of arrival:<BR> Comments:<BR> Dr Michaell Cowing patient, appendicitis

## 2011-08-31 NOTE — ED Notes (Signed)
MD at bedside. 

## 2011-08-31 NOTE — ED Provider Notes (Signed)
History     CSN: [id number]  Arrival date & time 08/31/11  1216   First MD Initiated Contact with Patient 08/31/11 1259      Chief Complaint  Patient presents with  . Abdominal Pain    HPI Patient seen by Dr. Artist Pais for right lower quadrant pain. Reports pain began 2 days ago and has progressively worsened. Pain relieved somewhat is Vicodin. Reports Dr. Artist Pais ordered a CT scan of the abdomen. This indicated he may have an early appendicitis. Patient was advised to go to [facility] for surgery. Patient is a 56 y.o. male presenting with abdominal pain. The history is provided by the patient and medical records.  Abdominal Pain The primary symptoms of the illness include abdominal pain and nausea. The primary symptoms of the illness do not include fever, shortness of breath, vomiting, diarrhea or dysuria. The current episode started 2 days ago. The onset of the illness was gradual. The problem has been gradually worsening.  Symptoms associated with the illness do not include chills, constipation, urgency, hematuria, frequency or back pain.    Past Medical History  Diagnosis Date  . Allergy     Past Surgical History  Procedure Date  . Knee reconstruction on right knee   . Hernia repair   . Shoulder surgery     Family History  Problem Relation Age of Onset  . Hyperthyroidism Mother   . Colon cancer Father   . Lung cancer Father   . Cardiomyopathy Father     History  Substance Use Topics  . Smoking status: Current Everyday Smoker -- 1.0 packs/day for 30 years  . Smokeless tobacco: Never Used  . Alcohol Use: Yes     one drink per night      Review of Systems  Constitutional: Negative for fever and chills.  Respiratory: Negative for shortness of breath.   Cardiovascular: Negative for chest pain.  Gastrointestinal: Positive for nausea and abdominal pain. Negative for vomiting, diarrhea, constipation, blood in stool and rectal pain.  Genitourinary: Negative for dysuria,  urgency, frequency, hematuria, flank pain, discharge, penile pain and testicular pain.  Musculoskeletal: Negative for back pain.  Neurological: Negative for dizziness, weakness, numbness and headaches.  All other systems reviewed and are negative.    Allergies  Review of patient's allergies indicates no known allergies.  Home Medications   Current Outpatient Rx  Name Route Sig Dispense Refill  . AMOXICILLIN-POT CLAVULANATE 875-125 MG PO TABS Oral Take 1 tablet by mouth 2 (two) times daily. He is taking for 10 days. He started on 08/30/11 and has not completed.     . BUPROPION HCL ER (XL) 300 MG PO TB24 Oral Take 300 mg by mouth every morning.      Marland Kitchen HYDROCODONE-ACETAMINOPHEN 5-500 MG PO TABS Oral Take 1 tablet by mouth every 8 (eight) hours as needed. For pain.     Marland Kitchen NAPROXEN SODIUM 220 MG PO TABS Oral Take 220 mg by mouth every 8 (eight) hours as needed. For pain.       BP 127/78  Pulse 85  Temp(Src) 98.1 F (36.7 C) (Oral)  Resp 16  SpO2 97%  Physical Exam  Constitutional: He is oriented to person, place, and time. He appears well-developed and well-nourished.  HENT:  Head: Normocephalic and atraumatic.  Eyes: Conjunctivae are normal. Pupils are equal, round, and reactive to light.  Neck: Normal range of motion. Neck supple.  Cardiovascular: Normal rate, regular rhythm and normal heart sounds.   Pulmonary/Chest: Effort  normal and breath sounds normal.  Abdominal: Soft. Normal appearance and bowel sounds are normal. He exhibits no mass. There is no hepatosplenomegaly. There is tenderness (Right flank. mild). There is no rigidity, no rebound, no guarding, no CVA tenderness, no tenderness at McBurney's point and negative Murphy's sign.    Neurological: He is alert and oriented to person, place, and time.  Skin: Skin is warm and dry. No rash noted. No erythema. No pallor.  Psychiatric: He has a normal mood and affect. His behavior is normal.    ED Course  ProceduresCt Abdomen  Pelvis W Contrast  08/30/2011  *RADIOLOGY REPORT*  Clinical Data: Severe right lower quadrant right flank pain and night sweats for 2 weeks.  CT ABDOMEN AND PELVIS WITH CONTRAST  Technique:  Multidetector CT imaging of the abdomen and pelvis was performed following the standard protocol during bolus administration of intravenous contrast.  Contrast: OMNIPAQUE IOHEXOL 300 MG/ML IV SOLN  Comparison: Chest CT 08/09/2007  Findings: Imaged lung bases are clear.  Two approximately 4 mm low density lesions are seen in the right lobe of the liver.  These are too small to characterize but statistically likely cysts.  The spleen, gallbladder, adrenal glands, pancreas, the right kidney are within normal limits.  There is an exophytic 17 mm cyst extending from the lower pole of the left kidney.  The abdominal aorta is normal in caliber contains scattered atherosclerotic calcification.  The appendix is visualized on images 53-60.  The proximal portion of the appendix is filled with oral contrast and measures 5 mm in caliber (normal).  The distal tip of the appendix does not opacify with contrast and does not have air within its lumen.  There is suggestion of thickening of the distal appendiceal wall.  The distal tip of the appendix measures up to 7.6 mm.  No definite periappendiceal stranding or fluid.  The patient has multiple diverticula of the sigmoid colon.  These do not show any acute inflammatory changes.  Urinary bladder is within normal limits.  Prostate gland normal in size.  There is no lymphadenopathy or ascites.  Negative for free air.  Small bowel loops are within normal limits for caliber.  The stomach is nondistended and unremarkable.  Disc height narrowing at L5-S1 with posterior disc bulge.  Disc height narrowing at L4-L5.  No acute or suspicious bony abnormality.  IMPRESSION:  1. Findings are suspicious for early appendicitis of the distal appendix/appendiceal tip, in this patient with right lower  quadrant pain.  This study is a call report. 2.  Sigmoid colon diverticulosis.  No evidence of acute diverticulitis. 3.  Left renal cyst.  Original Report Authenticated By: Britta Mccreedy, M.D.       MDM   1:39 PM Will CSS PA-C is in the room with the patient.      Thomasene Lot, [place] 08/31/11 1343  5:05 PM Patient has been seen by Will and Dr. gross I feel the patient is a light have acute appendicitis the pain is located right flank/upper quadrant. Reports pain is completely resolved. Patient is afebrile and has a normal white count. Patient had been able to tolerate PO last night with a burrito and today with a nutrigrain bar and crackers. Dr. gross provided the patient with information to followup in the office if needed requested that I discharge patient.   Thomasene Lot, PA-C 08/31/11 1728

## 2011-08-31 NOTE — ED Notes (Signed)
Pt had CT scan yesterday due to RLQ pain. Denies n/v. CT suspicious for early appendicitis, sent here to see on call doc, Gross.

## 2011-08-31 NOTE — H&P (Addendum)
Miguel Benjamin is an 56 y.o. male.   Chief Complaint: Abdominal Pain RLQ Primary Care: Darryll Capers HPI: Patient is a 56 year old gentleman who was in good health until 2 days ago he started having pain in his mid right abdomen. Yesterday morning when he got up he said he had stabbing pain each time he coughed . He took some Aleve and Motrin the pain improved but didn't completely resolve it was worse with coughing he contacted Dr. Lovell Sheehan office and was seen by Dr. Cleatis Polka. He was sent for a CT scan at approximately 5 PM yesterday. He was seen back in the clinic and started on Vicodin and Augmentin 875/125 by mouth twice a day. Our office was contacted this morning and he was directed to the emergency room for further evaluation. CT scan shows the proximal portion the appendix is filled with oral contrast and measured 5 mm in diameter the distal apex did not opacify and there is a suggestion of a thickened appendiceal wall the distal tip of the appendix measured 7.6 mm. There is no definite stranding or fluid collection. The sigmoid colon showed multiple diverticula but no inflammatory changes. There was concern that this might be early appendicitis.  Since last night he went home and a burrito. He denies any problems with nausea vomiting diarrhea or constipation no blood in his stool. His pain has improved, he still has discomfort when he coughs, his major source of pain is in the right upper lateral portion of his abdomen almost under his rib cage.  Past Medical History  Diagnosis Date  . Allergy    osteoarthritis involving both knees. Ongoing tobacco use.  Past Surgical History  Procedure Date  . Knee reconstruction on right knee   . Hernia repair   . Shoulder surgery    L right shoulder surgery Dr. Rennis Chris Lasix surgery both eyes. Left inguinal hernia repair 2000.   Family History  Problem Relation Age of Onset  . Hyperthyroidism Mother   . Colon cancer Father   . Lung cancer Father   .  Cardiomyopathy Father    Social History:  reports that he has been smoking.  He has never used smokeless tobacco. He reports that he drinks alcohol. He reports that he does not use illicit drugs. Tobacco one pack per day 30+ year Alcohol approximately 2 ounces per night. Drugs: None Allergies: No Known Allergies  Medications Prior to Admission  Medication Dose Route Frequency Provider Last Rate Last Dose  . iohexol (OMNIPAQUE) 300 MG/ML solution 100 mL  100 mL Intravenous Once PRN Medication Radiologist   100 mL at 08/30/11 1739   No current outpatient prescriptions on file as of 08/31/2011.   home meds: Wellbutrin 300 mg daily. Glucosamine/chondroitin supplement daily. Augmentin 875/125 twice a day he's had 2 doses since yesterday. Vicodin when necessary  Results for orders placed during the hospital encounter of 08/31/11 (from the past 48 hour(s))  CBC     Status: Normal   Collection Time   08/31/11 12:50 PM      Component Value Range Comment   WBC 4.8  4.0 - 10.5 (K/uL)    RBC 5.05  4.22 - 5.81 (MIL/uL)    Hemoglobin 15.3  13.0 - 17.0 (g/dL)    HCT 16.1  09.6 - 04.5 (%)    MCV 87.9  78.0 - 100.0 (fL)    MCH 30.3  26.0 - 34.0 (pg)    MCHC 34.5  30.0 - 36.0 (g/dL)  RDW 12.9  11.5 - 15.5 (%)    Platelets 225  150 - 400 (K/uL)   DIFFERENTIAL     Status: Abnormal   Collection Time   08/31/11 12:50 PM      Component Value Range Comment   Neutrophils Relative 61  43 - 77 (%)    Neutro Abs 3.0  1.7 - 7.7 (K/uL)    Lymphocytes Relative 23  12 - 46 (%)    Lymphs Abs 1.1  0.7 - 4.0 (K/uL)    Monocytes Relative 15 (*) 3 - 12 (%)    Monocytes Absolute 0.7  0.1 - 1.0 (K/uL)    Eosinophils Relative 1  0 - 5 (%)    Eosinophils Absolute 0.0  0.0 - 0.7 (K/uL)    Basophils Relative 1  0 - 1 (%)    Basophils Absolute 0.0  0.0 - 0.1 (K/uL)   COMPREHENSIVE METABOLIC PANEL     Status: Abnormal   Collection Time   08/31/11 12:50 PM      Component Value Range Comment   Sodium 137  135 - 145  (mEq/L)    Potassium 4.2  3.5 - 5.1 (mEq/L)    Chloride 102  96 - 112 (mEq/L)    CO2 23  19 - 32 (mEq/L)    Glucose, Bld 98  70 - 99 (mg/dL)    BUN 17  6 - 23 (mg/dL)    Creatinine, Ser 4.09  0.50 - 1.35 (mg/dL)    Calcium 8.9  8.4 - 10.5 (mg/dL)    Total Protein 6.7  6.0 - 8.3 (g/dL)    Albumin 3.8  3.5 - 5.2 (g/dL)    AST 20  0 - 37 (U/L)    ALT 20  0 - 53 (U/L)    Alkaline Phosphatase 78  39 - 117 (U/L)    Total Bilirubin 0.2 (*) 0.3 - 1.2 (mg/dL)    GFR calc non Af Amer 83 (*) >90 (mL/min)    GFR calc Af Amer >90  >90 (mL/min)    Ct Abdomen Pelvis W Contrast  08/30/2011  *RADIOLOGY REPORT*  Clinical Data: Severe right lower quadrant right flank pain and night sweats for 2 weeks.  CT ABDOMEN AND PELVIS WITH CONTRAST  Technique:  Multidetector CT imaging of the abdomen and pelvis was performed following the standard protocol during bolus administration of intravenous contrast.  Contrast: OMNIPAQUE IOHEXOL 300 MG/ML IV SOLN  Comparison: Chest CT 08/09/2007  Findings: Imaged lung bases are clear.  Two approximately 4 mm low density lesions are seen in the right lobe of the liver.  These are too small to characterize but statistically likely cysts.  The spleen, gallbladder, adrenal glands, pancreas, the right kidney are within normal limits.  There is an exophytic 17 mm cyst extending from the lower pole of the left kidney.  The abdominal aorta is normal in caliber contains scattered atherosclerotic calcification.  The appendix is visualized on images 53-60.  The proximal portion of the appendix is filled with oral contrast and measures 5 mm in caliber (normal).  The distal tip of the appendix does not opacify with contrast and does not have air within its lumen.  There is suggestion of thickening of the distal appendiceal wall.  The distal tip of the appendix measures up to 7.6 mm.  No definite periappendiceal stranding or fluid.  The patient has multiple diverticula of the sigmoid colon.   These do not show any acute inflammatory changes.  Urinary  bladder is within normal limits.  Prostate gland normal in size.  There is no lymphadenopathy or ascites.  Negative for free air.  Small bowel loops are within normal limits for caliber.  The stomach is nondistended and unremarkable.  Disc height narrowing at L5-S1 with posterior disc bulge.  Disc height narrowing at L4-L5.  No acute or suspicious bony abnormality.  IMPRESSION:  1. Findings are suspicious for early appendicitis of the distal appendix/appendiceal tip, in this patient with right lower quadrant pain.  This study is a call report. 2.  Sigmoid colon diverticulosis.  No evidence of acute diverticulitis. 3.  Left renal cyst.  Original Report Authenticated By: Britta Mccreedy, M.D.    Review of Systems  Constitutional: Negative for fever, chills and diaphoresis.  HENT: Negative.   Eyes: Negative.   Respiratory: Positive for cough and wheezing.   Cardiovascular: Negative.   Gastrointestinal: Negative for heartburn, nausea, vomiting, abdominal pain (Started 2 days ago right mid abdomen, then got to be a sharpe stabbing pain yesterday AM), diarrhea, constipation, blood in stool and melena.       No personal nor family history of GI/colon cancer, inflammatory bowel disease, irritable bowel syndrome, allergy such as Celiac Sprue, dietary/dairy problems, colitis, ulcers nor gastritis.    No recent sick contacts/gastroenteritis.  No travel outside the country.  No changes in diet.    Genitourinary: Negative.   Musculoskeletal: Positive for back pain (occasional back pains, but very active, within limits of knee.) and joint pain (Has problems with shoulders, R knee with multiple surgeries, L knee needs surgery).  Skin: Negative.   Neurological: Negative.  Negative for weakness.  Endo/Heme/Allergies: Negative.   Psychiatric/Behavioral:       On Wellbutrin to help with smoking cessation.    Blood pressure 127/78, pulse 85, temperature  98.1 F (36.7 C), temperature source Oral, resp. rate 16, SpO2 97.00%. Physical Exam  Constitutional: He is oriented to person, place, and time. He appears well-developed and well-nourished. No distress.  HENT:  Head: Normocephalic and atraumatic.  Nose: Nose normal.  Eyes: Conjunctivae and EOM are normal. Pupils are equal, round, and reactive to light.  Neck: Normal range of motion. Neck supple. No JVD present. No tracheal deviation present. No thyromegaly present.  Cardiovascular: Normal rate, regular rhythm, normal heart sounds and intact distal pulses.   Respiratory: Effort normal. Wheezes: bilateral. He has no rales. He exhibits tenderness (current discomfort most prnounced R lateral abdominal wall almost under rib cage.).  GI: Soft. Bowel sounds are normal. He exhibits no distension and no mass. There is no tenderness (His original complaint was mid right abdomen, then stabbing pain with coughing same area yesterday.  Better now, Complains of pain mostlly RUQ under rib cage.). There is no rigidity, no rebound, no guarding, no CVA tenderness, no tenderness at McBurney's point and negative Murphy's sign. Hernia confirmed negative in the right inguinal area and confirmed negative in the left inguinal area.    Musculoskeletal: Normal range of motion. He exhibits no edema and no tenderness.  Lymphadenopathy:    He has no cervical adenopathy.  Neurological: He is alert and oriented to person, place, and time. He has normal reflexes. No cranial nerve deficit. Coordination normal.  Skin: Skin is warm and dry. No rash noted. No erythema. No pallor.  Psychiatric: He has a normal mood and affect. His behavior is normal. Judgment and thought content normal.     Assessment/Plan See below  Miguel Benjamin 08/31/2011, 2:31 PM  I have re-reviewed  the the patient's records, history, medications, and allergies.  I have re-examined the patient.    General: Pt awake/alert/oriented x4 in no major acute  distress Eyes: PERRL, normal EOM. Neuro: CN II-XII intact w/o focal sensory/motor deficits. Lymph: No head/neck/groin lymphadenopathy Psych:  No delerium/psychosis/paranoia HEENT: Normocephalic, Mucus membranes moist.  No thrush Neck: Supple, No tracheal deviation Chest: No pain w good excursion.  Mod TTP R lateral rib cage over rib to touch & cough CV:  Pulses intact.  Regular rhythm Abdomen: soft, nontender/nondistended.  No incarcerated hernias.  No pain @ McBurney's point.  No Murphy's sign Ext:  SCDs BLE.  No mjr edema.  No cyanosis Skin: No petechiae / purpurae  This seems c/w MS etiology, not GI.  Pt starving, no N/V/C/D.  No anorexia.  No fevers/chills. ?sweats, but energy great.   Started with Bronchitis episode.  Pt much better today vs yest AM.  Rec trial of Heat/ NSAIDs.  We talked to the patient about the dangers of smoking.  We stressed that tobacco use dramatically increases the risk of peri-operative complications such as infection, tissue necrosis leaving to problems with incision/wound and organ healing, heart attack, stroke, DVT, pulmonary embolism, and death.  We noted there are programs in our community to help stop smoking.  The patient is stable.  There is no evidence of peritonitis, acute abdomen, nor shock.  There is no strong evidence of appendicitis, cholecystitis, SBO, incarc hernia, nor other surgical conditions.  There is no need for surgery at the present moment.  F/U CCS PRN    Ardeth Sportsman, M.D., F.A.C.S. Gastrointestinal and Minimally Invasive Surgery [facility] [address] [place], [place] [address] [phone] Main / Paging [phone] Voice Mail

## 2011-08-31 NOTE — ED Notes (Signed)
Pt c/o pain to rlq.

## 2011-08-31 NOTE — Telephone Encounter (Addendum)
Per Dr. Gerrit Friends contact  Dr. Michaell Cowing or P.A on call team at [facility]- have patient report to [facility]- appendicitis- Patient given information - Information given to [employer]. 11:20 message relayed to  with [facility] Engineer, manufacturing systems at [employer]).

## 2011-09-01 NOTE — ED Provider Notes (Signed)
Medical screening examination/treatment/procedure(s) were performed by non-physician practitioner and as supervising physician I was immediately available for consultation/collaboration.   Gerhard Munch, MD 09/01/11 [phone]

## 2011-11-01 ENCOUNTER — Ambulatory Visit: Payer: BC Managed Care – PPO | Admitting: Internal Medicine

## 2011-12-16 ENCOUNTER — Other Ambulatory Visit: Payer: Self-pay | Admitting: Internal Medicine

## 2012-03-15 ENCOUNTER — Other Ambulatory Visit: Payer: Self-pay | Admitting: Internal Medicine

## 2012-05-22 ENCOUNTER — Encounter: Payer: Self-pay | Admitting: Gastroenterology

## 2012-05-29 ENCOUNTER — Other Ambulatory Visit: Payer: Self-pay | Admitting: Internal Medicine

## 2012-06-01 ENCOUNTER — Telehealth: Payer: Self-pay | Admitting: Internal Medicine

## 2012-06-01 NOTE — Telephone Encounter (Signed)
Pt stated cpx does not have to be yr to date per Ins

## 2012-06-01 NOTE — Telephone Encounter (Signed)
Try the after noon of 11-25-please give 2 15 minute slots

## 2012-06-01 NOTE — Telephone Encounter (Signed)
Pt is sch for 07-17-2012 11.30am

## 2012-06-01 NOTE — Telephone Encounter (Signed)
Pt was schd for cpx on 07/24/12, but pcp is out of office and pt has to rsc. Pt has to get cpx done before end of year or pts insurance will go up by $1,000. Pt has been re-sch for 12/30, but if pcp cxs again pt will mess up insurance. Pt needs work in cpx, sooner than 08/21/12. Pt refuses to see NP or other Dr.

## 2012-06-01 NOTE — Telephone Encounter (Addendum)
Pt last cpx was 08-18-2011. Pt is aware cpx will not be yr to date and Ins may not pay

## 2012-06-29 ENCOUNTER — Ambulatory Visit (AMBULATORY_SURGERY_CENTER): Payer: BC Managed Care – PPO | Admitting: *Deleted

## 2012-06-29 ENCOUNTER — Encounter: Payer: Self-pay | Admitting: Gastroenterology

## 2012-06-29 VITALS — Ht 73.0 in | Wt 192.8 lb

## 2012-06-29 DIAGNOSIS — Z1211 Encounter for screening for malignant neoplasm of colon: Secondary | ICD-10-CM

## 2012-06-29 MED ORDER — PEG-KCL-NACL-NASULF-NA ASC-C 100 G PO SOLR: ORAL | Status: DC

## 2012-06-29 NOTE — Progress Notes (Signed)
No allergy to eggs or soy products  His surg path from 2007 shows adenomatous polyps

## 2012-07-05 ENCOUNTER — Telehealth: Payer: Self-pay | Admitting: Gastroenterology

## 2012-07-05 ENCOUNTER — Encounter: Payer: Self-pay | Admitting: *Deleted

## 2012-07-05 NOTE — Telephone Encounter (Signed)
Pt is calling to let us know that he forgot to tell us that he had COPD.  He does not use oxygen and is not having any problems at this time.  I added COPD to pt's medical history.

## 2012-07-10 ENCOUNTER — Other Ambulatory Visit: Payer: BC Managed Care – PPO

## 2012-07-11 ENCOUNTER — Other Ambulatory Visit (INDEPENDENT_AMBULATORY_CARE_PROVIDER_SITE_OTHER): Payer: BC Managed Care – PPO

## 2012-07-11 DIAGNOSIS — Z0000 Encounter for general adult medical examination without abnormal findings: Secondary | ICD-10-CM

## 2012-07-11 LAB — BASIC METABOLIC PANEL
CO2: 28 mEq/L (ref 19–32)
Chloride: 105 mEq/L (ref 96–112)
Glucose, Bld: 99 mg/dL (ref 70–99)
Sodium: 139 mEq/L (ref 135–145)

## 2012-07-11 LAB — POCT URINALYSIS DIPSTICK
Ketones, UA: NEGATIVE
Leukocytes, UA: NEGATIVE
Nitrite, UA: NEGATIVE
Protein, UA: NEGATIVE
pH, UA: 6

## 2012-07-11 LAB — CBC WITH DIFFERENTIAL/PLATELET
Basophils Relative: 0.5 % (ref 0.0–3.0)
Eosinophils Absolute: 0.2 10*3/uL (ref 0.0–0.7)
Hemoglobin: 13.9 g/dL (ref 13.0–17.0)
Lymphs Abs: 1.2 10*3/uL (ref 0.7–4.0)
MCHC: 33.1 g/dL (ref 30.0–36.0)
MCV: 93.4 fl (ref 78.0–100.0)
Monocytes Absolute: 0.6 10*3/uL (ref 0.1–1.0)
Neutro Abs: 5 10*3/uL (ref 1.4–7.7)
RBC: 4.49 Mil/uL (ref 4.22–5.81)

## 2012-07-11 LAB — LIPID PANEL: VLDL: 17 mg/dL (ref 0.0–40.0)

## 2012-07-11 LAB — HEPATIC FUNCTION PANEL
Albumin: 3.9 g/dL (ref 3.5–5.2)
Total Protein: 6.2 g/dL (ref 6.0–8.3)

## 2012-07-11 LAB — TSH: TSH: 1.14 u[IU]/mL (ref 0.35–5.50)

## 2012-07-11 LAB — PSA: PSA: 2.17 ng/mL (ref 0.10–4.00)

## 2012-07-13 ENCOUNTER — Other Ambulatory Visit: Payer: Self-pay | Admitting: *Deleted

## 2012-07-13 MED ORDER — BUPROPION HCL ER (XL) 300 MG PO TB24: 300.0000 mg | ORAL_TABLET | ORAL | Status: DC

## 2012-07-17 ENCOUNTER — Ambulatory Visit (INDEPENDENT_AMBULATORY_CARE_PROVIDER_SITE_OTHER): Payer: BC Managed Care – PPO | Admitting: Internal Medicine

## 2012-07-17 ENCOUNTER — Encounter: Payer: Self-pay | Admitting: Internal Medicine

## 2012-07-17 VITALS — BP 130/80 | HR 80 | Temp 98.2°F | Resp 16 | Ht 73.0 in | Wt 191.0 lb

## 2012-07-17 DIAGNOSIS — N410 Acute prostatitis: Secondary | ICD-10-CM

## 2012-07-17 DIAGNOSIS — Z0000 Encounter for general adult medical examination without abnormal findings: Secondary | ICD-10-CM

## 2012-07-17 MED ORDER — CIPROFLOXACIN HCL 500 MG PO TABS: 500.0000 mg | ORAL_TABLET | Freq: Two times a day (BID) | ORAL | Status: DC

## 2012-07-17 NOTE — Patient Instructions (Signed)
Take all 3 weeks of medication for prostatitis call if the night sweats do not resolve

## 2012-07-17 NOTE — Progress Notes (Signed)
  Subjective:    Patient ID: Miguel Benjamin, male    DOB: Apr 24, 1956, 56 y.o.   MRN: [id number]  HPI Smoking cessation for 2 months Moderate night sweats Normal white counts No sweats with travel Increased urination and hx of prostate infection     Review of Systems  Constitutional: Negative for fever and fatigue.       Night sweats  HENT: Negative for hearing loss, congestion, neck pain and postnasal drip.   Eyes: Negative for discharge, redness and visual disturbance.  Respiratory: Negative for cough, shortness of breath and wheezing.   Cardiovascular: Negative for leg swelling.  Gastrointestinal: Negative for abdominal pain, constipation and abdominal distention.  Genitourinary: Negative for urgency and frequency.  Musculoskeletal: Negative for joint swelling and arthralgias.  Skin: Negative for color change and rash.  Neurological: Negative for weakness and light-headedness.  Hematological: Negative for adenopathy.  Psychiatric/Behavioral: Negative for behavioral problems.       Objective:   Physical Exam  Nursing note and vitals reviewed. Constitutional: He is oriented to person, place, and time. He appears well-developed and well-nourished.  HENT:  Head: Normocephalic and atraumatic.  Eyes: Conjunctivae normal are normal. Pupils are equal, round, and reactive to light.  Neck: Normal range of motion. Neck supple.  Cardiovascular: Normal rate and regular rhythm.   Pulmonary/Chest: Effort normal and breath sounds normal.  Abdominal: Soft. Bowel sounds are normal.  Genitourinary:       Is enlarged and boggy  Musculoskeletal: Normal range of motion.  Neurological: He is alert and oriented to person, place, and time.  Skin: Skin is warm and dry.  Psychiatric: He has a normal mood and affect. His behavior is normal.          Assessment & Plan:  Possible hypoglycemia, probable prostatitis  Patient presents for yearly preventative medicine examination.   all  immunizations and health maintenance protocols were reviewed with the patient and they are up to date with these protocols.   screening laboratory values were reviewed with the patient including screening of hyperlipidemia PSA renal function and hepatic function.   There medications past medical history social history problem list and allergies were reviewed in detail.   Goals were established with regard to weight loss exercise diet in compliance with medications

## 2012-07-22 IMAGING — CT CT ABD-PELV W/ CM
2 of 5 series · 16 of 46 positions shown, 18 images · IV contrast ([ID] OMNI 300)
Comparison: Chest CT 08/09/2007

CLINICAL DATA: Severe right lower quadrant right flank pain and
night sweats for 2 weeks.

CT ABDOMEN AND PELVIS WITH CONTRAST
TECHNIQUE: Multidetector CT imaging of the abdomen and pelvis was
performed following the standard protocol during bolus
administration of intravenous contrast.
Contrast: 100mL OMNIPAQUE IOHEXOL 300 MG/ML IV SOLN

[Series 2: abd/pelvis with · axial · 0.70mm/px · z∈[-390,-30]mm · 13 of 80 slices shown, 15 images]
[im 5/80  soft-tissue]
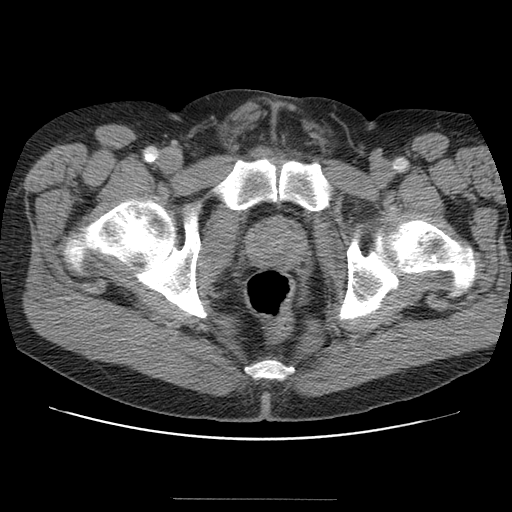
[im 5/80  bone]
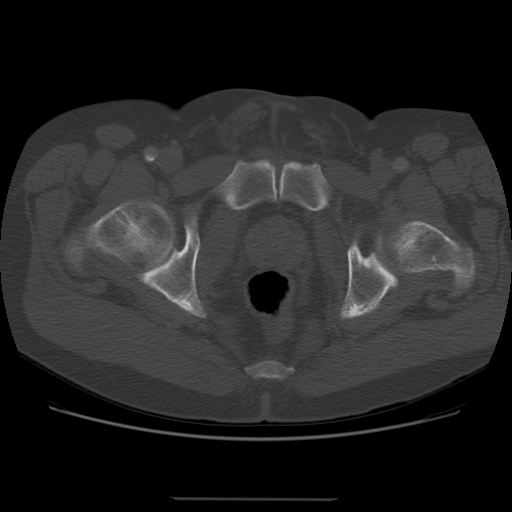
[im 9/80  soft-tissue]
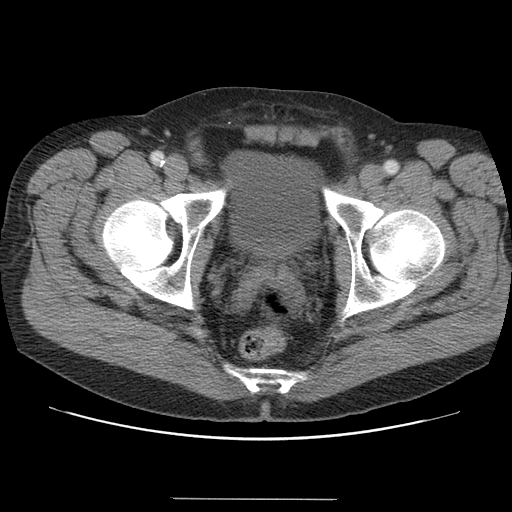
[im 18/80  soft-tissue]
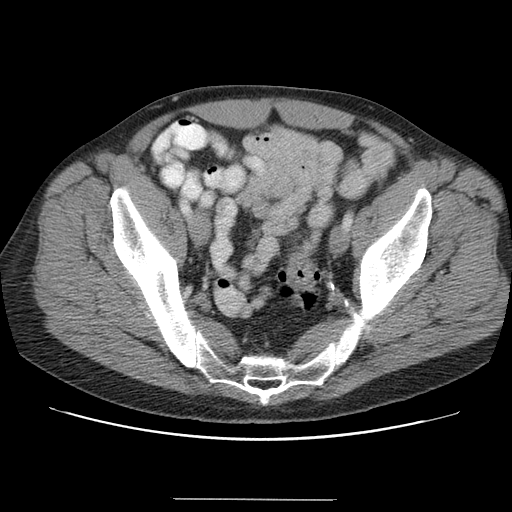
[im 22/80  soft-tissue]
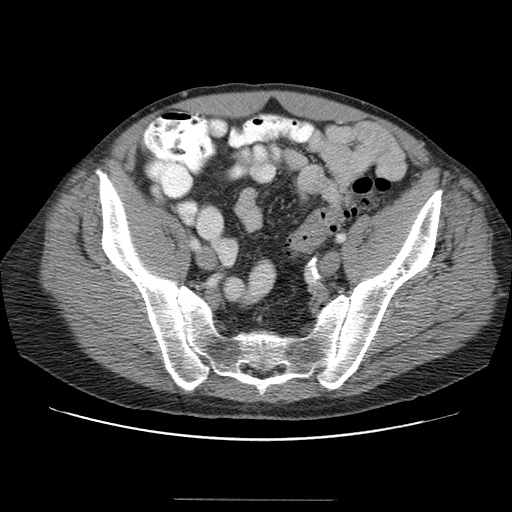
[im 27/80  soft-tissue]
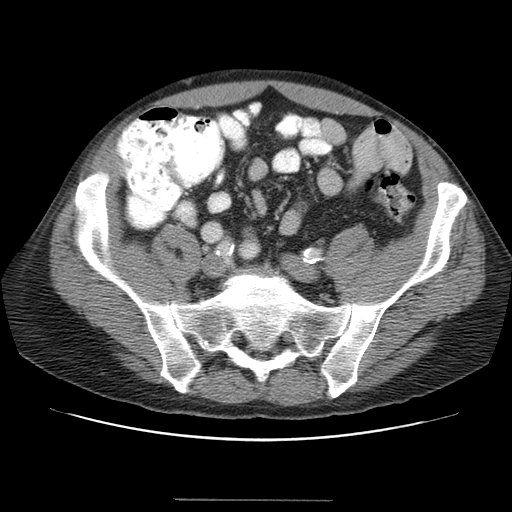
[im 36/80  soft-tissue]
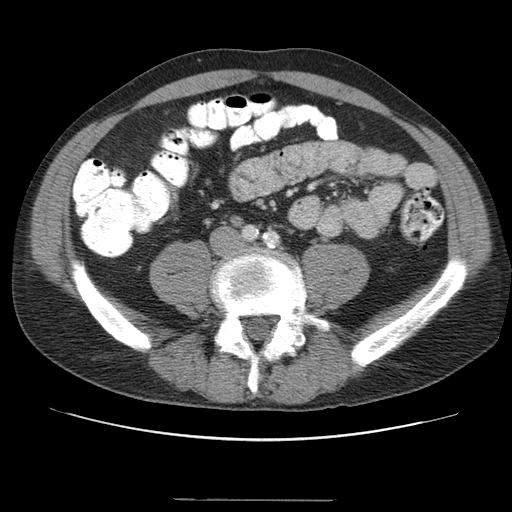
[im 40/80  soft-tissue]
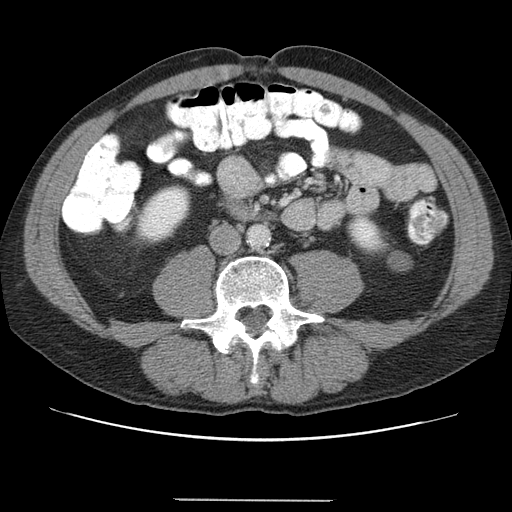
[im 44/80  soft-tissue]
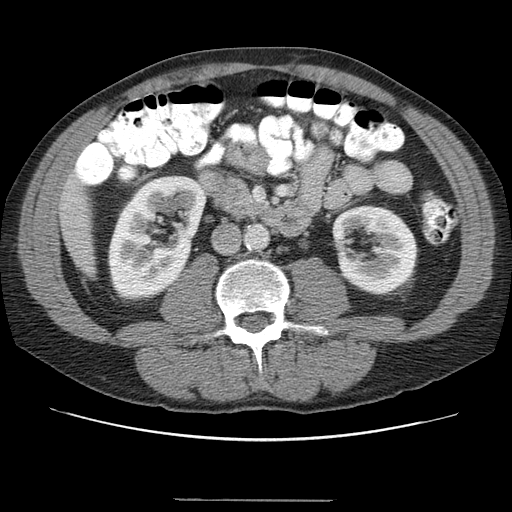
[im 53/80  soft-tissue]
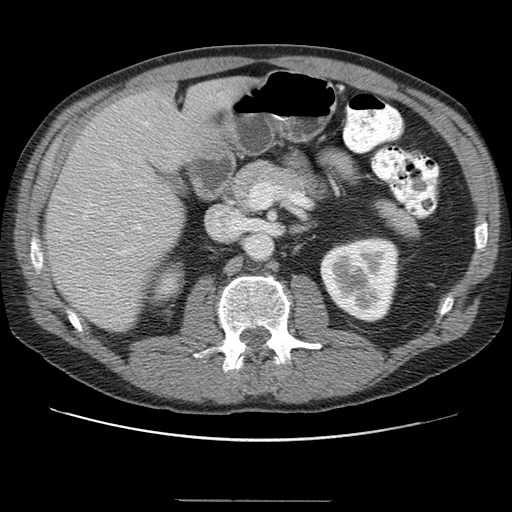
[im 53/80  bone]
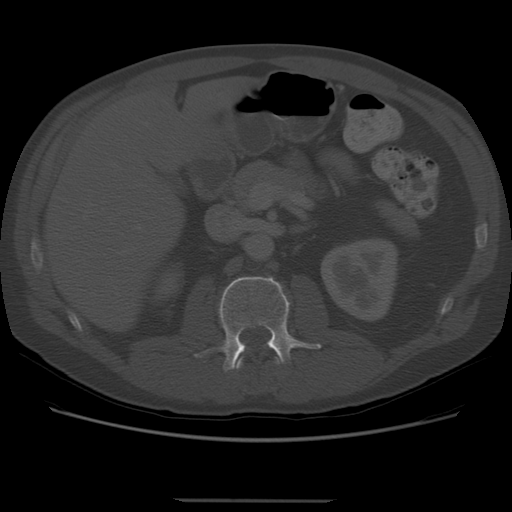
[im 58/80  soft-tissue]
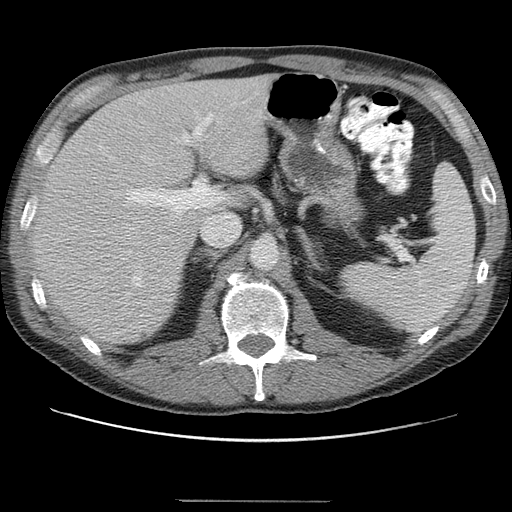
[im 62/80  soft-tissue]
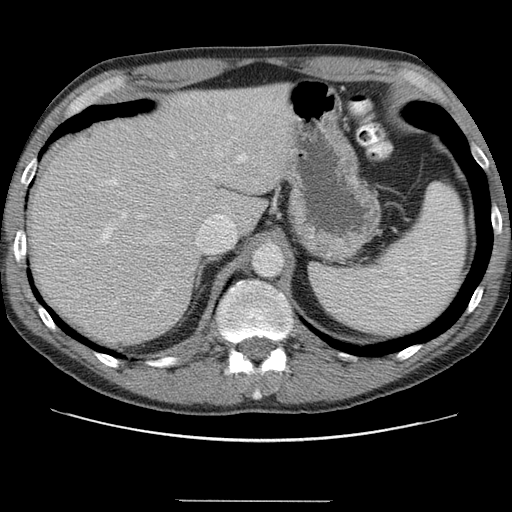
[im 71/80  soft-tissue]
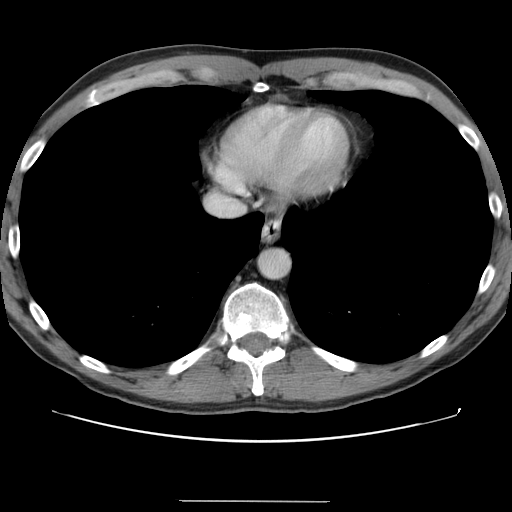
[im 75/80  soft-tissue]
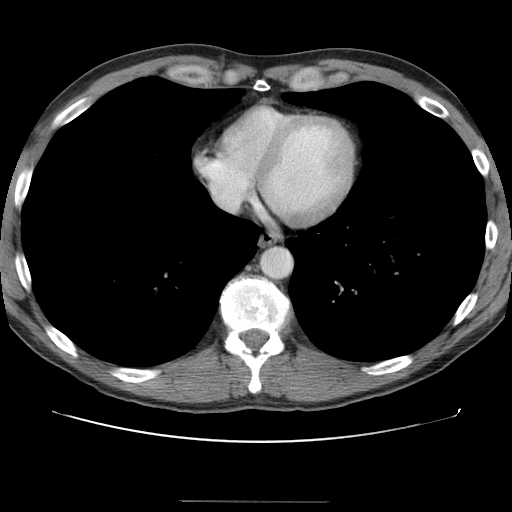

[Series 400: cor · coronal · 0.95mm/px · 3 of 117 slices shown]
[im 39/117  soft-tissue]
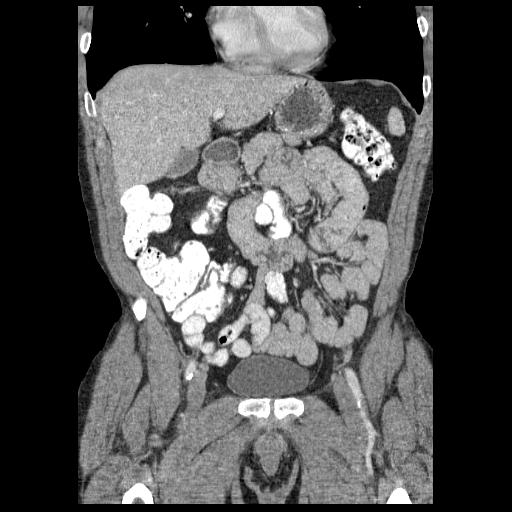
[im 52/117  soft-tissue]
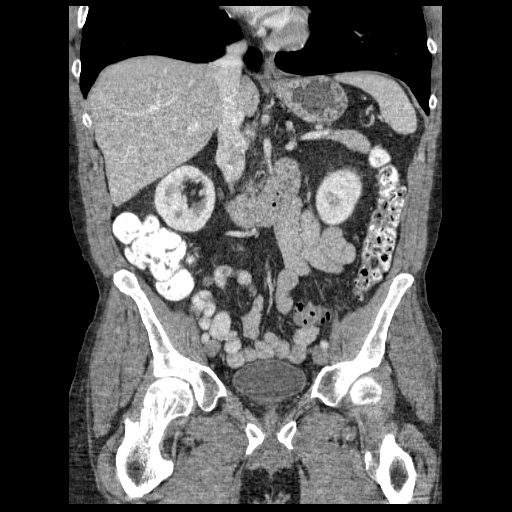
[im 65/117  soft-tissue]
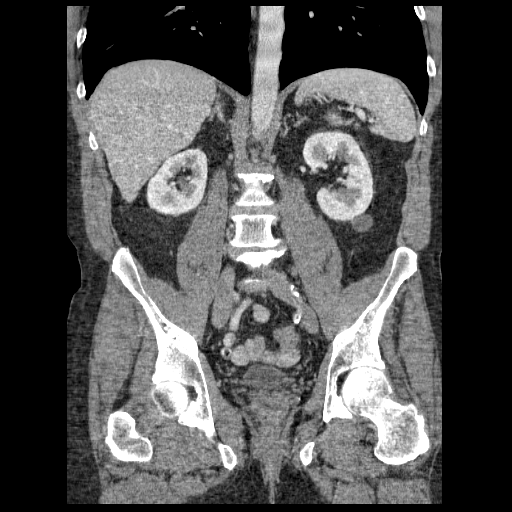

[16 of 46 positions shown; findings below may reference images not displayed]

FINDINGS: Imaged lung bases are clear.

Two approximately 4 mm low density lesions are seen in the right
lobe of the liver.  These are too small to characterize but
statistically likely cysts.  The spleen, gallbladder, adrenal
glands, pancreas, the right kidney are within normal limits.  There
is an exophytic 17 mm cyst extending from the lower pole of the
left kidney.

The abdominal aorta is normal in caliber contains scattered
atherosclerotic calcification.

The appendix is visualized on images 53-60.  The proximal portion
of the appendix is filled with oral contrast and measures 5 mm in
caliber (normal).  The distal tip of the appendix does not opacify
with contrast and does not have air within its lumen.  There is
suggestion of thickening of the distal appendiceal wall.  The
distal tip of the appendix measures up to 7.6 mm.  No definite
periappendiceal stranding or fluid.

The patient has multiple diverticula of the sigmoid colon.  These
do not show any acute inflammatory changes.  Urinary bladder is
within normal limits.  Prostate gland normal in size.  There is no
lymphadenopathy or ascites.  Negative for free air.  Small bowel
loops are within normal limits for caliber.  The stomach is
nondistended and unremarkable.

Disc height narrowing at L5-S1 with posterior disc bulge.  Disc
height narrowing at L4-L5.  No acute or suspicious bony
abnormality.
IMPRESSION: 1. Findings are suspicious for early appendicitis of the distal
appendix/appendiceal tip, in this patient with right lower quadrant
pain.  This study is a call report.
2.  Sigmoid colon diverticulosis.  No evidence of acute
diverticulitis.
3.  Left renal cyst.

## 2012-07-24 ENCOUNTER — Encounter: Payer: BC Managed Care – PPO | Admitting: Internal Medicine

## 2012-07-25 ENCOUNTER — Ambulatory Visit (AMBULATORY_SURGERY_CENTER): Payer: BC Managed Care – PPO | Admitting: Gastroenterology

## 2012-07-25 ENCOUNTER — Encounter: Payer: Self-pay | Admitting: Gastroenterology

## 2012-07-25 VITALS — BP 115/70 | HR 70 | Temp 96.5°F | Resp 22 | Ht 73.0 in | Wt 192.0 lb

## 2012-07-25 DIAGNOSIS — Z1211 Encounter for screening for malignant neoplasm of colon: Secondary | ICD-10-CM

## 2012-07-25 DIAGNOSIS — Z86010 Personal history of colonic polyps: Secondary | ICD-10-CM

## 2012-07-25 MED ORDER — SODIUM CHLORIDE 0.9 % IV SOLN: 500.0000 mL | INTRAVENOUS | Status: DC

## 2012-07-25 NOTE — Op Note (Signed)
[facility] [address] [place], [address]   COLONOSCOPY PROCEDURE REPORT  PATIENT: Miguel Benjamin, Miguel Benjamin.  MR#: [id number] BIRTHDATE: 07/11/1956 , 56  yrs. old GENDER: Male ENDOSCOPIST: Meryl Dare, MD, [facility] PROCEDURE DATE:  07/25/2012 PROCEDURE:   Colonoscopy, diagnostic ASA CLASS:   Class II INDICATIONS:Patient's personal history of adenomatous colon polyps in 2007. MEDICATIONS: MAC sedation, administered by CRNA and propofol (Diprivan) 450mg  IV DESCRIPTION OF PROCEDURE:   After the risks benefits and alternatives of the procedure were thoroughly explained, informed consent was obtained.  A digital rectal exam revealed no abnormalities of the rectum.   The LB [id number]  endoscope was introduced through the anus and advanced to the cecum, which was identified by both the appendix and ileocecal valve. No adverse events experienced.   The quality of the prep was excellent, using MoviPrep  The instrument was then slowly withdrawn as the colon was fully examined.  COLON FINDINGS: Mild diverticulosis was noted in the sigmoid colon. The colon was otherwise normal.  There was no diverticulosis, inflammation, polyps or cancers unless previously stated. Retroflexed views revealed no abnormalities. The time to cecum=1 minutes 24 seconds.  Withdrawal time=11 minutes 00 seconds.  The scope was withdrawn and the procedure completed.  COMPLICATIONS: There were no complications.  ENDOSCOPIC IMPRESSION: 1.   Mild diverticulosis was noted in the sigmoid colon 2.   The colon was otherwise normal  RECOMMENDATIONS: 1.  High fiber diet with liberal fluid intake. 2.  Repeat Colonoscopy in 5 years.  eSigned:  Meryl Dare, MD, [facility] 07/25/2012 8:57 AM

## 2012-07-25 NOTE — Progress Notes (Signed)
Patient did not experience any of the following events: a burn prior to discharge; a fall within the facility; wrong site/side/patient/procedure/implant event; or a hospital transfer or hospital admission upon discharge from the facility. (G8907) Patient did not have preoperative order for IV antibiotic SSI prophylaxis. (G8918)  

## 2012-07-25 NOTE — Patient Instructions (Addendum)

## 2012-07-26 ENCOUNTER — Telehealth: Payer: Self-pay

## 2012-07-26 NOTE — Telephone Encounter (Signed)
  Follow up Call-  Call back number 07/25/2012  Post procedure Call Back phone  # [phone]  Permission to leave phone message Yes     Patient questions:  Do you have a fever, pain , or abdominal swelling? no Pain Score  0 *  Have you tolerated food without any problems? yes  Have you been able to return to your normal activities? yes  Do you have any questions about your discharge instructions: Diet   no Medications  no Follow up visit  no  Do you have questions or concerns about your Care? no  Actions: * If pain score is 4 or above: No action needed, pain <4.  Per the pt "everything is great". Maw

## 2012-08-21 ENCOUNTER — Encounter: Payer: BC Managed Care – PPO | Admitting: Internal Medicine

## 2013-02-07 ENCOUNTER — Other Ambulatory Visit: Payer: Self-pay | Admitting: *Deleted

## 2013-02-07 MED ORDER — BUPROPION HCL ER (XL) 300 MG PO TB24: 300.0000 mg | ORAL_TABLET | ORAL | Status: DC

## 2013-08-01 ENCOUNTER — Other Ambulatory Visit: Payer: Self-pay | Admitting: *Deleted

## 2013-08-01 MED ORDER — TADALAFIL 5 MG PO TABS: 5.0000 mg | ORAL_TABLET | Freq: Every day | ORAL | Status: DC | PRN

## 2014-01-11 ENCOUNTER — Telehealth: Payer: Self-pay | Admitting: Internal Medicine

## 2014-01-11 NOTE — Telephone Encounter (Signed)
Pt would like to know if you will accept him, his wife and son as pts.  They all are pts of dr Lovell Sheehan and prefer to see one provider.

## 2014-01-11 NOTE — Telephone Encounter (Signed)
yes

## 2014-01-16 NOTE — Telephone Encounter (Signed)
Pt aware dr Caryl Never will accept family. Family members do not needs apt at this time, will call and schedule.

## 2014-04-02 ENCOUNTER — Other Ambulatory Visit: Payer: Self-pay | Admitting: Internal Medicine

## 2015-08-12 ENCOUNTER — Telehealth: Payer: Self-pay | Admitting: Internal Medicine

## 2015-08-12 NOTE — Telephone Encounter (Signed)
Pt wife sees dr Caryl Neverburchette and pt thought his was already establish with dr burchette. Pt last seen dr Lovell Sheehanjenkins 11- 2013. Pt is having ear pain. Can I use acute slot and having pt set up with dr Caryl Neverburchette.?

## 2015-08-12 NOTE — Telephone Encounter (Signed)
Yes. Thx.

## 2015-08-13 ENCOUNTER — Ambulatory Visit (INDEPENDENT_AMBULATORY_CARE_PROVIDER_SITE_OTHER): Payer: Managed Care, Other (non HMO) | Admitting: Family Medicine

## 2015-08-13 ENCOUNTER — Encounter: Payer: Self-pay | Admitting: Family Medicine

## 2015-08-13 VITALS — BP 130/88 | HR 103 | Temp 97.8°F | Resp 14 | Ht 73.0 in | Wt 194.7 lb

## 2015-08-13 DIAGNOSIS — H6123 Impacted cerumen, bilateral: Secondary | ICD-10-CM

## 2015-08-13 NOTE — Telephone Encounter (Signed)
Pt has been sch

## 2015-08-13 NOTE — Progress Notes (Signed)
Pre visit review using our clinic review tool, if applicable. No additional management support is needed unless otherwise documented below in the visit note. 

## 2015-08-13 NOTE — Progress Notes (Signed)
   Subjective:    Patient ID: Miguel Benjamin, male    DOB: 06/18/1956, 59 y.o.   MRN: [id number]  HPI Bilateral ear pain right greater than left His job requires frequent flying. He's noticed several weeks now of right ear fullness. History of cerumen impactions in the past. No drainage. No major hearing deficits. No vertigo. No headaches.  Patient is been seen here previously but not over 3 years. Ongoing nicotine use. Does not taking the regular medications. Overdue for complete physical.  Father had bladder cancer. Patient's had multiple orthopedic surgeries previously.  Past Medical History  Diagnosis Date  . Allergy   . Arthritis   . COPD (chronic obstructive pulmonary disease) [facility](HCC)    Past Surgical History  Procedure Laterality Date  . Knee reconstruction on right knee    . Hernia repair    . Shoulder surgery      reports that he has been smoking.  He has never used smokeless tobacco. He reports that he drinks alcohol. He reports that he does not use illicit drugs. family history includes Cancer in his father; Cardiomyopathy in his father; Hyperthyroidism in his mother; Lung cancer in his mother. There is no history of Colon cancer, Esophageal cancer, Stomach cancer, or Rectal cancer. No Known Allergies    Review of Systems  Constitutional: Negative for fever and chills.  HENT: Positive for ear pain. Negative for ear discharge and hearing loss.   Neurological: Negative for dizziness and headaches.       Objective:   Physical Exam  Constitutional: He appears well-developed and well-nourished.  HENT:  Mouth/Throat: Oropharynx is clear and moist.  Bilateral cerumen impactions  Cardiovascular: Normal rate and regular rhythm.   Pulmonary/Chest: Effort normal and breath sounds normal. No respiratory distress. He has no wheezes. He has no rales.          Assessment & Plan:  Bilateral cerumen impactions. Irrigation with removal. Set up complete physical

## 2015-08-13 NOTE — Patient Instructions (Signed)
Cerumen Impaction The structures of the external ear canal secrete a waxy substance known as cerumen. Excess cerumen can build up in the ear canal, causing a condition known as cerumen impaction. Cerumen impaction can cause ear pain and disrupt the function of the ear. The rate of cerumen production differs for each individual. In certain individuals, the configuration of the ear canal may decrease his or her ability to naturally remove cerumen. CAUSES Cerumen impaction is caused by excessive cerumen production or buildup. RISK FACTORS  Frequent use of swabs to clean ears.  Having narrow ear canals.  Having eczema.  Being dehydrated. SIGNS AND SYMPTOMS  Diminished hearing.  Ear drainage.  Ear pain.  Ear itch. TREATMENT Treatment may involve:  Over-the-counter or prescription ear drops to soften the cerumen.  Removal of cerumen by a health care provider. This may be done with:  Irrigation with warm water. This is the most common method of removal.  Ear curettes and other instruments.  Surgery. This may be done in severe cases. HOME CARE INSTRUCTIONS  Take medicines only as directed by your health care provider.  Do not insert objects into the ear with the intent of cleaning the ear. PREVENTION  Do not insert objects into the ear, even with the intent of cleaning the ear. Removing cerumen as a part of normal hygiene is not necessary, and the use of swabs in the ear canal is not recommended.  Drink enough water to keep your urine clear or pale yellow.  Control your eczema if you have it. SEEK MEDICAL CARE IF:  You develop ear pain.  You develop bleeding from the ear.  The cerumen does not clear after you use ear drops as directed.   This information is not intended to replace advice given to you by your health care provider. Make sure you discuss any questions you have with your health care provider.   Document Released: 09/16/2004 Document Revised: 08/30/2014  Document Reviewed: 03/26/2015 Elsevier Interactive Patient Education 2016 Elsevier Inc.  

## 2015-09-29 ENCOUNTER — Other Ambulatory Visit: Payer: Managed Care, Other (non HMO)

## 2015-10-06 ENCOUNTER — Encounter: Payer: Self-pay | Admitting: Family Medicine

## 2016-07-14 ENCOUNTER — Encounter: Payer: Self-pay | Admitting: Family Medicine

## 2016-07-14 ENCOUNTER — Ambulatory Visit (INDEPENDENT_AMBULATORY_CARE_PROVIDER_SITE_OTHER): Payer: Managed Care, Other (non HMO) | Admitting: Family Medicine

## 2016-07-14 VITALS — BP 100/70 | HR 105 | Temp 98.0°F | Ht 73.0 in | Wt 194.7 lb

## 2016-07-14 DIAGNOSIS — L251 Unspecified contact dermatitis due to drugs in contact with skin: Secondary | ICD-10-CM | POA: Diagnosis not present

## 2016-07-14 MED ORDER — PREDNISONE 10 MG PO TABS: ORAL_TABLET | ORAL | 0 refills | Status: DC

## 2016-07-14 NOTE — Progress Notes (Signed)
Subjective:     Patient ID: Miguel Benjamin, male   DOB: 11-20-1955, 60 y.o.   MRN: [id number]  HPI  Acute visit for skin irritation. About 2 weeks ago he tried topical product called "just for men"on his beard. By the next day he had some pruritus, redness, and mild serous ooze from the skin in his beard region. He subsequently developed pruritic rash on the dorsum of his left hand and slightly on the anterior chest. No alleviating factors. No fevers or chills.  Past Medical History:  Diagnosis Date  . Allergy   . Arthritis   . COPD (chronic obstructive pulmonary disease) (HCC)    Past Surgical History:  Procedure Laterality Date  . HERNIA REPAIR    . knee reconstruction on right knee    . SHOULDER SURGERY      reports that he has been smoking.  He has a 30.00 pack-year smoking history. He has never used smokeless tobacco. He reports that he drinks alcohol. He reports that he does not use drugs. family history includes Cancer in his father; Cardiomyopathy in his father; Hyperthyroidism in his mother; Lung cancer in his mother. No Known Allergies  Review of Systems  Constitutional: Negative for chills and fever.  Skin: Positive for rash.  Hematological: Negative for adenopathy.       Objective:   Physical Exam  Constitutional: He appears well-developed and well-nourished.  Cardiovascular: Normal rate and regular rhythm.   Pulmonary/Chest: Effort normal and breath sounds normal. No respiratory distress. He has no wheezes. He has no rales.  Skin: Rash noted.  Patient has some nonspecific erythema and irritation of the beard region. No pustules.  He has some nonspecific excoriations dorsum left hand. He has a couple similar areas anterior chest wall       Assessment:     Contact dermatitis from topical as above    Plan:     -Prednisone taper over the next 12 days. Avoid further use of product above -Follow-up as needed  Kristian CoveyBruce W Ruba Outen MD [facility]  [facility]

## 2016-07-14 NOTE — Progress Notes (Signed)
Pre visit review using our clinic review tool, if applicable. No additional management support is needed unless otherwise documented below in the visit note. 

## 2016-07-14 NOTE — Patient Instructions (Signed)
Contact Dermatitis Introduction Dermatitis is redness, soreness, and swelling (inflammation) of the skin. Contact dermatitis is a reaction to certain substances that touch the skin. There are two types of contact dermatitis:  Irritant contact dermatitis. This type is caused by something that irritates your skin, such as dry hands from washing them too much. This type does not require previous exposure to the substance for a reaction to occur. This type is more common.  Allergic contact dermatitis. This type is caused by a substance that you are allergic to, such as a nickel allergy or poison ivy. This type only occurs if you have been exposed to the substance (allergen) before. Upon a repeat exposure, your body reacts to the substance. This type is less common. What are the causes? Many different substances can cause contact dermatitis. Irritant contact dermatitis is most commonly caused by exposure to:  Makeup.  Soaps.  Detergents.  Bleaches.  Acids.  Metal salts, such as nickel. Allergic contact dermatitis is most commonly caused by exposure to:  Poisonous plants.  Chemicals.  Jewelry.  Latex.  Medicines.  Preservatives in products, such as clothing. What increases the risk? This condition is more likely to develop in:  People who have jobs that expose them to irritants or allergens.  People who have certain medical conditions, such as asthma or eczema. What are the signs or symptoms? Symptoms of this condition may occur anywhere on your body where the irritant has touched you or is touched by you. Symptoms include:  Dryness or flaking.  Redness.  Cracks.  Itching.  Pain or a burning feeling.  Blisters.  Drainage of small amounts of blood or clear fluid from skin cracks. With allergic contact dermatitis, there may also be swelling in areas such as the eyelids, mouth, or genitals. How is this diagnosed? This condition is diagnosed with a medical history and  physical exam. A patch skin test may be performed to help determine the cause. If the condition is related to your job, you may need to see an occupational medicine specialist. How is this treated? Treatment for this condition includes figuring out what caused the reaction and protecting your skin from further contact. Treatment may also include:  Steroid creams or ointments. Oral steroid medicines may be needed in more severe cases.  Antibiotics or antibacterial ointments, if a skin infection is present.  Antihistamine lotion or an antihistamine taken by mouth to ease itching.  A bandage (dressing). Follow these instructions at home: Skin Care  Moisturize your skin as needed.  Apply cool compresses to the affected areas.  Try taking a bath with:  Epsom salts. Follow the instructions on the packaging. You can get these at your local pharmacy or grocery store.  Baking soda. Pour a small amount into the bath as directed by your health care provider.  Colloidal oatmeal. Follow the instructions on the packaging. You can get this at your local pharmacy or grocery store.  Try applying baking soda paste to your skin. Stir water into baking soda until it reaches a paste-like consistency.  Do not scratch your skin.  Bathe less frequently, such as every other day.  Bathe in lukewarm water. Avoid using hot water. Medicines  Take or apply over-the-counter and prescription medicines only as told by your health care provider.  If you were prescribed an antibiotic medicine, take or apply your antibiotic as told by your health care provider. Do not stop using the antibiotic even if your condition starts to improve. General instructions    Keep all follow-up visits as told by your health care provider. This is important.  Avoid the substance that caused your reaction. If you do not know what caused it, keep a journal to try to track what caused it. Write down:  What you eat.  What cosmetic  products you use.  What you drink.  What you wear in the affected area. This includes jewelry.  If you were given a dressing, take care of it as told by your health care provider. This includes when to change and remove it. Contact a health care provider if:  Your condition does not improve with treatment.  Your condition gets worse.  You have signs of infection such as swelling, tenderness, redness, soreness, or warmth in the affected area.  You have a fever.  You have new symptoms. Get help right away if:  You have a severe headache, neck pain, or neck stiffness.  You vomit.  You feel very sleepy.  You notice red streaks coming from the affected area.  Your bone or joint underneath the affected area becomes painful after the skin has healed.  The affected area turns darker.  You have difficulty breathing. This information is not intended to replace advice given to you by your health care provider. Make sure you discuss any questions you have with your health care provider. Document Released: 08/06/2000 Document Revised: 01/15/2016 Document Reviewed: 12/25/2014  2017 Elsevier  

## 2016-11-12 ENCOUNTER — Encounter: Payer: Self-pay | Admitting: Family Medicine

## 2016-11-12 ENCOUNTER — Ambulatory Visit (INDEPENDENT_AMBULATORY_CARE_PROVIDER_SITE_OTHER): Payer: Managed Care, Other (non HMO) | Admitting: Family Medicine

## 2016-11-12 VITALS — BP 128/70 | HR 79 | Temp 97.5°F | Ht 73.0 in | Wt 194.4 lb

## 2016-11-12 DIAGNOSIS — H6123 Impacted cerumen, bilateral: Secondary | ICD-10-CM | POA: Diagnosis not present

## 2016-11-12 DIAGNOSIS — R221 Localized swelling, mass and lump, neck: Secondary | ICD-10-CM | POA: Diagnosis not present

## 2016-11-12 MED ORDER — CARBAMIDE PEROXIDE 6.5 % OT SOLN
5.0000 [drp] | Freq: Once | OTIC | Status: AC
  Administered 2016-11-12: 5 [drp] via OTIC

## 2016-11-12 NOTE — Progress Notes (Signed)
Subjective:     Patient ID: Miguel Benjamin, male   DOB: [birth year], 61 y.o.   MRN: [id number]  HPI Patient seen with bilateral ear fullness and some earache on the right side. Sometimes feels that water is getting trapped behind wax after showering. No hearing loss. No dizziness. No drainage. He's had cerumen impactions in the past.  Past Medical History:  Diagnosis Date  . Allergy   . Arthritis   . COPD (chronic obstructive pulmonary disease) (HCC)    Past Surgical History:  Procedure Laterality Date  . HERNIA REPAIR    . knee reconstruction on right knee    . SHOULDER SURGERY      reports that he has been smoking.  He has a 30.00 pack-year smoking history. He has never used smokeless tobacco. He reports that he drinks alcohol. He reports that he does not use drugs. family history includes Cancer in his father; Cardiomyopathy in his father; Hyperthyroidism in his mother; Lung cancer in his mother. No Known Allergies   Review of Systems  HENT: Positive for ear pain. Negative for ear discharge and hearing loss.        Objective:   Physical Exam  Constitutional: He appears well-developed and well-nourished.  HENT:  Patient initially had cerumen both canals and after irrigation both canals were fully cleared and eardrums appear normal  Neck:  No neck adenopathy. Patient has approximately 1 x 2 cm fatty consistency mobile nontender subcutaneous fatty mass which is just below the right angle the mandible       Assessment:     #1 Bilateral cerumen impaction removed with irrigation  #2 probable lipoma right neck    Plan:     -Patient encouraged to set up complete physical -follow up for any rapid growth or other changes to right neck mass-suspected lipoma.  Kristian CoveyBruce W Sharne Linders MD [facility]

## 2016-11-12 NOTE — Patient Instructions (Signed)
Earwax Buildup Your ears make a substance called earwax. It may also be called cerumen. Sometimes, too much earwax builds up in your ear canal. This can cause ear pain and make it harder for you to hear. CAUSES This condition is caused by too much earwax production or buildup. RISK FACTORS The following factors may make you more likely to develop this condition:  Cleaning your ears often with swabs.  Having narrow ear canals.  Having earwax that is overly thick or sticky.  Having eczema.  Being dehydrated. SYMPTOMS Symptoms of this condition include:  Reduced hearing.  Ear drainage.  Ear pain.  Ear itch.  A feeling of fullness in the ear or feeling that the ear is plugged.  Ringing in the ear.  Coughing. DIAGNOSIS Your health care provider can diagnose this condition based on your symptoms and medical history. Your health care provider will also do an ear exam to look inside your ear with a scope (otoscope). You may also have a hearing test. TREATMENT Treatment for this condition includes:  Over-the-counter or prescription ear drops to soften the earwax.  Earwax removal by a health care provider. This may be done:  By flushing the ear with body-temperature water.  With a medical instrument that has a loop at the end (earwax curette).  With a suction device. HOME CARE INSTRUCTIONS  Take over-the-counter and prescription medicines only as told by your health care provider.  Do not put any objects, including an ear swab, into your ear. You can clean the opening of your ear canal with a washcloth.  Drink enough water to keep your urine clear or pale yellow.  If you have frequent earwax buildup or you use hearing aids, consider seeing your health care provider every 6-12 months for routine preventive ear cleanings. Keep all follow-up visits as told by your health care provider. SEEK MEDICAL CARE IF:  You have ear pain.  Your condition does not improve with  treatment.  You have hearing loss.  You have blood, pus, or other fluid coming from your ear. This information is not intended to replace advice given to you by your health care provider. Make sure you discuss any questions you have with your health care provider. Document Released: 09/16/2004 Document Revised: 12/01/2015 Document Reviewed: 03/26/2015 Elsevier Interactive Patient Education  2017 Elsevier Inc.  

## 2016-11-12 NOTE — Progress Notes (Signed)
Pre visit review using our clinic review tool, if applicable. No additional management support is needed unless otherwise documented below in the visit note. 

## 2017-05-10 ENCOUNTER — Encounter: Payer: Self-pay | Admitting: Family Medicine

## 2017-05-10 ENCOUNTER — Ambulatory Visit (INDEPENDENT_AMBULATORY_CARE_PROVIDER_SITE_OTHER): Payer: Managed Care, Other (non HMO) | Admitting: Family Medicine

## 2017-05-10 VITALS — BP 110/80 | HR 107 | Temp 98.5°F | Ht 71.5 in | Wt 187.2 lb

## 2017-05-10 DIAGNOSIS — Z0000 Encounter for general adult medical examination without abnormal findings: Secondary | ICD-10-CM | POA: Diagnosis not present

## 2017-05-10 DIAGNOSIS — Z125 Encounter for screening for malignant neoplasm of prostate: Secondary | ICD-10-CM

## 2017-05-10 DIAGNOSIS — Z23 Encounter for immunization: Secondary | ICD-10-CM

## 2017-05-10 DIAGNOSIS — Z01818 Encounter for other preprocedural examination: Secondary | ICD-10-CM

## 2017-05-10 LAB — CBC WITH DIFFERENTIAL/PLATELET
BASOS PCT: 0.5 % (ref 0.0–3.0)
Basophils Absolute: 0 10*3/uL (ref 0.0–0.1)
EOS PCT: 1.2 % (ref 0.0–5.0)
Eosinophils Absolute: 0.1 10*3/uL (ref 0.0–0.7)
HEMATOCRIT: 47.1 % (ref 39.0–52.0)
Hemoglobin: 15.6 g/dL (ref 13.0–17.0)
LYMPHS ABS: 1.6 10*3/uL (ref 0.7–4.0)
Lymphocytes Relative: 19.7 % (ref 12.0–46.0)
MCHC: 33.2 g/dL (ref 30.0–36.0)
MCV: 95.2 fl (ref 78.0–100.0)
MONOS PCT: 8.8 % (ref 3.0–12.0)
Monocytes Absolute: 0.7 10*3/uL (ref 0.1–1.0)
NEUTROS ABS: 5.7 10*3/uL (ref 1.4–7.7)
Neutrophils Relative %: 69.8 % (ref 43.0–77.0)
PLATELETS: 242 10*3/uL (ref 150.0–400.0)
RBC: 4.94 Mil/uL (ref 4.22–5.81)
RDW: 13.8 % (ref 11.5–15.5)
WBC: 8.2 10*3/uL (ref 4.0–10.5)

## 2017-05-10 LAB — HEPATIC FUNCTION PANEL
ALBUMIN: 4.2 g/dL (ref 3.5–5.2)
ALT: 18 U/L (ref 0–53)
AST: 19 U/L (ref 0–37)
Alkaline Phosphatase: 55 U/L (ref 39–117)
BILIRUBIN DIRECT: 0.1 mg/dL (ref 0.0–0.3)
TOTAL PROTEIN: 6.3 g/dL (ref 6.0–8.3)
Total Bilirubin: 0.6 mg/dL (ref 0.2–1.2)

## 2017-05-10 LAB — BASIC METABOLIC PANEL
BUN: 22 mg/dL (ref 6–23)
CO2: 30 mEq/L (ref 19–32)
CREATININE: 1.08 mg/dL (ref 0.40–1.50)
Calcium: 10.1 mg/dL (ref 8.4–10.5)
Chloride: 104 mEq/L (ref 96–112)
GFR: 73.9 mL/min (ref >60.00)
Glucose, Bld: 90 mg/dL (ref 70–99)
Potassium: 5.6 mEq/L — ABNORMAL HIGH (ref 3.5–5.1)
SODIUM: 141 meq/L (ref 135–145)

## 2017-05-10 LAB — LIPID PANEL
CHOLESTEROL: 249 mg/dL — AB (ref 0–200)
HDL: 125.4 mg/dL (ref >39.00)
LDL Cholesterol: 112 mg/dL — ABNORMAL HIGH (ref 0–99)
NonHDL: 123.98
TRIGLYCERIDES: 62 mg/dL (ref 0.0–149.0)
Total CHOL/HDL Ratio: 2
VLDL: 12.4 mg/dL (ref 0.0–40.0)

## 2017-05-10 LAB — POCT URINALYSIS DIPSTICK
Bilirubin, UA: NEGATIVE
CLARITY UA: NEGATIVE
GLUCOSE UA: NEGATIVE
KETONES UA: NEGATIVE
Leukocytes, UA: NEGATIVE
Nitrite, UA: NEGATIVE
PROTEIN UA: NEGATIVE
RBC UA: NEGATIVE
Urobilinogen, UA: 0.2 E.U./dL
pH, UA: 5.5 (ref 5.0–8.0)

## 2017-05-10 LAB — PSA: PSA: 4.24 ng/mL — ABNORMAL HIGH (ref 0.10–4.00)

## 2017-05-10 LAB — TSH: TSH: 0.81 u[IU]/mL (ref 0.35–4.50)

## 2017-05-10 NOTE — Progress Notes (Signed)
Subjective:     Patient ID: Miguel Benjamin, male   DOB: [birth year], 61 y.o.   MRN: [id number]  HPI Patient seen for physical exam. He has history of severe osteoarthritis involving both knees and is getting ready to get left total knee replacement in December. He's had benign hyperplastic polyps on previous Colonoscopy 5 years ago and is due for repeat at this time. He also needs tetanus booster. He's not had previous shingles vaccine. He donates blood and thus has been screened for hepatitis C.  Current smoker but actually quit about 2 weeks ago and is using patches. Over 40 pack year history. No history of CAD. No history of hypertension or diabetes.  Currently takes no regular medications.  Past Medical History:  Diagnosis Date  . Allergy   . Arthritis   . COPD (chronic obstructive pulmonary disease) (HCC)    Past Surgical History:  Procedure Laterality Date  . HERNIA REPAIR    . knee reconstruction on right knee    . SHOULDER SURGERY      reports that he quit smoking about 2 years ago. He has a 30.00 pack-year smoking history. He has never used smokeless tobacco. He reports that he drinks alcohol. He reports that he does not use drugs. family history includes Cancer in his father; Cardiomyopathy in his father; Hyperthyroidism in his mother; Lung cancer in his mother. No Known Allergies   Review of Systems  Constitutional: Negative for activity change, appetite change, fatigue and fever.  HENT: Negative for congestion, ear pain and trouble swallowing.   Eyes: Negative for pain and visual disturbance.  Respiratory: Negative for cough, shortness of breath and wheezing.   Cardiovascular: Negative for chest pain and palpitations.  Gastrointestinal: Negative for abdominal distention, abdominal pain, blood in stool, constipation, diarrhea, nausea, rectal pain and vomiting.  Endocrine: Negative for polydipsia and polyuria.  Genitourinary: Negative for dysuria, hematuria and  testicular pain.  Musculoskeletal: Positive for arthralgias. Negative for joint swelling.  Skin: Negative for rash.  Neurological: Negative for dizziness, syncope and headaches.  Hematological: Negative for adenopathy.  Psychiatric/Behavioral: Negative for confusion and dysphoric mood.       Objective:   Physical Exam  Constitutional: He is oriented to person, place, and time. He appears well-developed and well-nourished. No distress.  HENT:  Head: Normocephalic and atraumatic.  Right Ear: External ear normal.  Left Ear: External ear normal.  Mouth/Throat: Oropharynx is clear and moist.  Eyes: Pupils are equal, round, and reactive to light. Conjunctivae and EOM are normal.  Neck: Normal range of motion. Neck supple. No thyromegaly present.  Patient has small movable fatty consistency mass between the right ear and mandible region. This is approximately 1 and1/2 cm diameter. He states has been present for several years unchanging  Cardiovascular: Normal rate, regular rhythm and normal heart sounds.   No murmur heard. Pulmonary/Chest: No respiratory distress. He has no wheezes. He has no rales.  Somewhat diminished breath sounds throughout  Abdominal: Soft. Bowel sounds are normal. He exhibits no distension and no mass. There is no tenderness. There is no rebound and no guarding.  Musculoskeletal: He exhibits no edema.  Lymphadenopathy:    He has no cervical adenopathy.  Neurological: He is alert and oriented to person, place, and time. He displays normal reflexes. No cranial nerve deficit.  Skin: No rash noted.  Psychiatric: He has a normal mood and affect.       Assessment:     Physical exam. Several issues addressed  as below. Ongoing nicotine use and patient in process of trying to quit    Plan:     -Check EKG preoperatively prior to knee replacement surgery=NSR with no acute changes. -Discussed new shingles vaccine and he would like to proceed -Tetanus booster  given -Patient will need repeat colonoscopy this year -Obtain screening lab work. Patient requesting PSA we discussed pros and cons of PSA screening -We discussed low-dose lung cancer CT scan he wishes to proceed. He does meet criteria in terms of age, pack year history, and recent use -EKG shows sinus rhythm with no acute changes  Kristian Covey MD [facility] at [facility]

## 2017-05-10 NOTE — Patient Instructions (Signed)
Steps to Quit Smoking Smoking tobacco can be harmful to your health and can affect almost every organ in your body. Smoking puts you, and those around you, at risk for developing many serious chronic diseases. Quitting smoking is difficult, but it is one of the best things that you can do for your health. It is never too late to quit. What are the benefits of quitting smoking? When you quit smoking, you lower your risk of developing serious diseases and conditions, such as:  Lung cancer or lung disease, such as COPD.  Heart disease.  Stroke.  Heart attack.  Infertility.  Osteoporosis and bone fractures.  Additionally, symptoms such as coughing, wheezing, and shortness of breath may get better when you quit. You may also find that you get sick less often because your body is stronger at fighting off colds and infections. If you are pregnant, quitting smoking can help to reduce your chances of having a baby of low birth weight. How do I get ready to quit? When you decide to quit smoking, create a plan to make sure that you are successful. Before you quit:  Pick a date to quit. Set a date within the next two weeks to give you time to prepare.  Write down the reasons why you are quitting. Keep this list in places where you will see it often, such as on your bathroom mirror or in your car or wallet.  Identify the people, places, things, and activities that make you want to smoke (triggers) and avoid them. Make sure to take these actions: ? Throw away all cigarettes at home, at work, and in your car. ? Throw away smoking accessories, such as ashtrays and lighters. ? Clean your car and make sure to empty the ashtray. ? Clean your home, including curtains and carpets.  Tell your family, friends, and coworkers that you are quitting. Support from your loved ones can make quitting easier.  Talk with your health care provider about your options for quitting smoking.  Find out what treatment  options are covered by your health insurance.  What strategies can I use to quit smoking? Talk with your healthcare provider about different strategies to quit smoking. Some strategies include:  Quitting smoking altogether instead of gradually lessening how much you smoke over a period of time. Research shows that quitting "cold turkey" is more successful than gradually quitting.  Attending in-person counseling to help you build problem-solving skills. You are more likely to have success in quitting if you attend several counseling sessions. Even short sessions of 10 minutes can be effective.  Finding resources and support systems that can help you to quit smoking and remain smoke-free after you quit. These resources are most helpful when you use them often. They can include: ? Online chats with a counselor. ? Telephone quitlines. ? Printed self-help materials. ? Support groups or group counseling. ? Text messaging programs. ? Mobile phone applications.  Taking medicines to help you quit smoking. (If you are pregnant or breastfeeding, talk with your health care provider first.) Some medicines contain nicotine and some do not. Both types of medicines help with cravings, but the medicines that include nicotine help to relieve withdrawal symptoms. Your health care provider may recommend: ? Nicotine patches, gum, or lozenges. ? Nicotine inhalers or sprays. ? Non-nicotine medicine that is taken by mouth.  Talk with your health care provider about combining strategies, such as taking medicines while you are also receiving in-person counseling. Using these two strategies together   makes you more likely to succeed in quitting than if you used either strategy on its own. If you are pregnant or breastfeeding, talk with your health care provider about finding counseling or other support strategies to quit smoking. Do not take medicine to help you quit smoking unless told to do so by your health care  provider. What things can I do to make it easier to quit? Quitting smoking might feel overwhelming at first, but there is a lot that you can do to make it easier. Take these important actions:  Reach out to your family and friends and ask that they support and encourage you during this time. Call telephone quitlines, reach out to support groups, or work with a counselor for support.  Ask people who smoke to avoid smoking around you.  Avoid places that trigger you to smoke, such as bars, parties, or smoke-break areas at work.  Spend time around people who do not smoke.  Lessen stress in your life, because stress can be a smoking trigger for some people. To lessen stress, try: ? Exercising regularly. ? Deep-breathing exercises. ? Yoga. ? Meditating. ? Performing a body scan. This involves closing your eyes, scanning your body from head to toe, and noticing which parts of your body are particularly tense. Purposefully relax the muscles in those areas.  Download or purchase mobile phone or tablet apps (applications) that can help you stick to your quit plan by providing reminders, tips, and encouragement. There are many free apps, such as QuitGuide from the [employer] developer for Disease Control and Prevention). You can find other support for quitting smoking (smoking cessation) through smokefree.gov and other websites.  How will I feel when I quit smoking? Within the first 24 hours of quitting smoking, you may start to feel some withdrawal symptoms. These symptoms are usually most noticeable 2-3 days after quitting, but they usually do not last beyond 2-3 weeks. Changes or symptoms that you might experience include:  Mood swings.  Restlessness, anxiety, or irritation.  Difficulty concentrating.  Dizziness.  Strong cravings for sugary foods in addition to nicotine.  Mild weight gain.  Constipation.  Nausea.  Coughing or a sore throat.  Changes in how your medicines work in your  body.  A depressed mood.  Difficulty sleeping (insomnia).  After the first 2-3 weeks of quitting, you may start to notice more positive results, such as:  Improved sense of smell and taste.  Decreased coughing and sore throat.  Slower heart rate.  Lower blood pressure.  Clearer skin.  The ability to breathe more easily.  Fewer sick days.  Quitting smoking is very challenging for most people. Do not get discouraged if you are not successful the first time. Some people need to make many attempts to quit before they achieve long-term success. Do your best to stick to your quit plan, and talk with your health care provider if you have any questions or concerns. This information is not intended to replace advice given to you by your health care provider. Make sure you discuss any questions you have with your health care provider. Document Released: 08/03/2001 Document Revised: 04/06/2016 Document Reviewed: 12/24/2014 Elsevier Interactive Patient Education  2017 [employer].  You will need repeat colonoscopy at some point next year Repeat Shingrix in 2 months. I will set up low dose CT lung cancer screen.

## 2017-05-11 ENCOUNTER — Telehealth: Payer: Self-pay | Admitting: Family Medicine

## 2017-05-11 ENCOUNTER — Other Ambulatory Visit: Payer: Self-pay | Admitting: Family Medicine

## 2017-05-11 DIAGNOSIS — R972 Elevated prostate specific antigen [PSA]: Secondary | ICD-10-CM

## 2017-05-11 NOTE — Telephone Encounter (Signed)
Pt has signed up for my chart and would like results released so he can se them please.

## 2017-05-11 NOTE — Telephone Encounter (Signed)
released

## 2017-05-12 ENCOUNTER — Encounter: Payer: Self-pay | Admitting: Family Medicine

## 2017-06-01 ENCOUNTER — Telehealth: Payer: Self-pay

## 2017-06-01 NOTE — Telephone Encounter (Signed)
We received a fax from [facility] about surgery clearance for patient's surgery on 08/01/17. Can we get him scheduled to come in for a surgery clearance appt?  Thank you!

## 2017-06-03 NOTE — Telephone Encounter (Signed)
Pt called back and state he is not sure why he needs to come in for a surgical clearance he just had a complete physical in September and need to know what he needs to do so that he can have his surgery in December.

## 2017-06-03 NOTE — Telephone Encounter (Signed)
I can fill out his forms for clearance (without additional follow up at this time)

## 2017-06-03 NOTE — Telephone Encounter (Signed)
Is patient's physical from 9/18 enough to clear him for surgery?

## 2017-06-03 NOTE — Telephone Encounter (Signed)
Left message for pt to call back and make an appt for surgery clearance.

## 2017-06-06 NOTE — Telephone Encounter (Signed)
Patient does not need appointment.

## 2017-07-04 NOTE — Telephone Encounter (Signed)
Pt aware no appt needed

## 2017-07-05 ENCOUNTER — Ambulatory Visit: Payer: Self-pay | Admitting: Orthopedic Surgery

## 2017-07-05 ENCOUNTER — Ambulatory Visit (INDEPENDENT_AMBULATORY_CARE_PROVIDER_SITE_OTHER): Payer: Managed Care, Other (non HMO)

## 2017-07-05 DIAGNOSIS — Z23 Encounter for immunization: Secondary | ICD-10-CM | POA: Diagnosis not present

## 2017-07-09 ENCOUNTER — Ambulatory Visit: Payer: Self-pay | Admitting: Orthopedic Surgery

## 2017-07-09 NOTE — H&P (Signed)
Miguel Benjamin DOB: [birth year] Married / Language: [place] / Race: White Male Date of admission: August 01, 2017  Chief complaint: Left knee pain History of Present Illness The patient is a 61 year old male who comes in  for a preoperative History and Physical. The patient is scheduled for a left total knee arthroplasty to be performed by Dr. Gus RankinFrank V. Aluisio, [facility] 08-01-2017. The patient is a 61 year old male who presented for follow up of their hip. The patient is being followed for their left hip pain. They are month(s) out from symptoms worsening. Symptoms reported include: pain and difficulty ambulating. Current treatment includes: use of a cane. The following medication has been used for pain control: antiinflammatory medication (Aleve bid). Miguel Benjamin is having significant pain and significant dysfunction in his LEFT knee. He has had end-stage arthritis any for many years and now is at a point where he feels like he needs to get effects. It is hurting at all times. He is having marked functional limitations also. He has had multiple injections in the past and they are no longer beneficial. He is now ready to proceed with surgery. They have been treated conservatively in the past for the above stated problem and despite conservative measures, they continue to have progressive pain and severe functional limitations and dysfunction. They have failed non-operative management including home exercise, medications, and injections. It is felt that they would benefit from undergoing total joint replacement. Risks and benefits of the procedure have been discussed with the patient and they elect to proceed with surgery. There are no active contraindications to surgery such as ongoing infection or rapidly progressive neurological disease.   Problem List/Past Medical Primary osteoarthritis of both knees (M17.0)  COPD  mild   Allergies  No Known Drug Allergies  Family  History Cancer  father Heart Disease  father  Social History  Alcohol use  current drinker; drinks beer, wine and hard liquor; less than 5 per week Current work status  working full time Drug/Alcohol Rehab (Currently)  no Drug/Alcohol Rehab (Previously)  no Exercise  Exercises weekly; does individual sport and team sport Illicit drug use  no Living situation  live with spouse Marital status  married Number of flights of stairs before winded  greater than 5 Pain Contract  no Tobacco / smoke exposure  no Tobacco use  current some days smoker; smoke(d) 3/4 pack(s) per day Post-Surgical Plans  Home With Family, Home, Straight to Outpatient Therapy. Advance Directives  Living Will, Healthcare Power of [place]wingle[place]orney.  Medication History Aleve (220MG  Capsule, 1 (one) Oral) Active. Omega-3 Fish Oil (1000MG  Capsule, Oral) Active. Glucosamine Chondr 1500 Complx (Oral) Specific strength unknown - Active. Vit D Active. Vit B12 Active.  Past Surgical History  Arthroscopy of Knee  right Arthroscopy of Shoulder  right Colon Polyp Removal - Colonoscopy  Inguinal Hernia Repair  open: right Other Surgery  knee reconstruction - right Rotator Cuff Repair  right Tonsillectomy   Review of Systems  General Present- Night Sweats. Not Present- Chills, Fatigue, Fever, Memory Loss, Weight Gain and Weight Loss. Skin Not Present- Eczema, Hives, Itching, Lesions and Rash. HEENT Not Present- Dentures, Double Vision, Headache, Hearing Loss, Tinnitus and Visual Loss. Respiratory Not Present- Allergies, Chronic Cough, Coughing up blood, Shortness of breath at rest and Shortness of breath with exertion. Cardiovascular Not Present- Chest Pain, Difficulty Breathing Lying Down, Murmur, Palpitations, Racing/skipping heartbeats and Swelling. Gastrointestinal Not Present- Abdominal Pain, Bloody Stool, Constipation, Diarrhea,  Difficulty Swallowing, Heartburn, Jaundice, Loss of appetitie,  Nausea and Vomiting. Male Genitourinary Present- Urinating at Night. Not Present- Blood in Urine, Discharge, Flank Pain, Incontinence, Painful Urination, Urgency, Urinary frequency, Urinary Retention and Weak urinary stream. Musculoskeletal Present- Joint Pain and Joint Swelling. Not Present- Back Pain, Morning Stiffness, Muscle Pain, Muscle Weakness and Spasms. Neurological Not Present- Blackout spells, Difficulty with balance, Dizziness, Paralysis, Tremor and Weakness. Psychiatric Not Present- Insomnia.  Vitals Weight: 185 lb Height: 72in Weight was reported by patient. Height was reported by patient. Body Surface Area: 2.06 m Body Mass Index: 25.09 kg/m  Pulse: 76 (Regular)  BP: 130/84 (Sitting, Right Arm, Standard)   Physical Exam  General Mental Status -Alert, cooperative and good historian. General Appearance-pleasant, Not in acute distress. Orientation-Oriented X3. Build & Nutrition-Well nourished and Well developed.  Head and Neck Head-normocephalic, atraumatic . Neck Global Assessment - supple, no bruit auscultated on the right, no bruit auscultated on the left.  Eye Pupil - Bilateral-Regular and Round. Motion - Bilateral-EOMI.  Chest and Lung Exam Auscultation Breath sounds - clear at anterior chest wall and clear at posterior chest wall. Adventitious sounds - Expiratory wheeze - Left Lower Lobe (Posterior)(does clear with coughing).  Cardiovascular Auscultation Rhythm - Regular rate and rhythm. Heart Sounds - S1 WNL and S2 WNL. Murmurs & Other Heart Sounds - Auscultation of the heart reveals - No Murmurs.  Abdomen Palpation/Percussion Tenderness - Abdomen is non-tender to palpation. Rigidity (guarding) - Abdomen is soft. Auscultation Auscultation of the abdomen reveals - Bowel sounds normal.  Male Genitourinary Note: Not done, not pertinent to present illness   Musculoskeletal Note: His LEFT knee shows trace effusion. His range  of motion is 10-120. There is marked crepitus on range of motion of the LEFT knee. He has significant varus deformity. He has significant varus thrust with this leg. There is significant varus valgus and anterior posterior laxity of the knee.    Radiographs AP and lateral of the LEFT knee show severe end-stage arthritis bone-on-bone all 3 compartments with significant varus deformity and significant tibial subluxation.   Assessment & Plan  Primary osteoarthritis of both knees (M17.0)  Note:Surgical Plans: Left Total Knee Replacement  Disposition: Home, Straight to outpatient therpay at [facility] on Thursday 08/04/17  PCP: Dr. Burchette  IV TXA  Anesthesia Issues: None  Patient was instructed on what medications to stop prior to surgery.  Signed electronically by Alexzandrew L Perkins, III PA-C  

## 2017-07-09 NOTE — H&P (View-Only) (Signed)
Miguel Benjamin DOB: [birth year] Married / Language: [place] / Race: White Male Date of admission: August 01, 2017  Chief complaint: Left knee pain History of Present Illness The patient is a 61 year old male who comes in  for a preoperative History and Physical. The patient is scheduled for a left total knee arthroplasty to be performed by Dr. Gus RankinFrank V. Aluisio, MD at [facility] on 08-01-2017. The patient is a 61 year old male who presented for follow up of their hip. The patient is being followed for their left hip pain. They are month(s) out from symptoms worsening. Symptoms reported include: pain and difficulty ambulating. Current treatment includes: use of a cane. The following medication has been used for pain control: antiinflammatory medication (Aleve bid). Miguel Benjamin is having significant pain and significant dysfunction in his LEFT knee. He has had end-stage arthritis any for many years and now is at a point where he feels like he needs to get effects. It is hurting at all times. He is having marked functional limitations also. He has had multiple injections in the past and they are no longer beneficial. He is now ready to proceed with surgery. They have been treated conservatively in the past for the above stated problem and despite conservative measures, they continue to have progressive pain and severe functional limitations and dysfunction. They have failed non-operative management including home exercise, medications, and injections. It is felt that they would benefit from undergoing total joint replacement. Risks and benefits of the procedure have been discussed with the patient and they elect to proceed with surgery. There are no active contraindications to surgery such as ongoing infection or rapidly progressive neurological disease.   Problem List/Past Medical Primary osteoarthritis of both knees (M17.0)  COPD  mild   Allergies  No Known Drug Allergies  Family  History Cancer  father Heart Disease  father  Social History  Alcohol use  current drinker; drinks beer, wine and hard liquor; less than 5 per week Current work status  working full time Drug/Alcohol Rehab (Currently)  no Drug/Alcohol Rehab (Previously)  no Exercise  Exercises weekly; does individual sport and team sport Illicit drug use  no Living situation  live with spouse Marital status  married Number of flights of stairs before winded  greater than 5 Pain Contract  no Tobacco / smoke exposure  no Tobacco use  current some days smoker; smoke(d) 3/4 pack(s) per day Post-Surgical Plans  Home With Family, Home, Straight to Outpatient Therapy. Advance Directives  Living Will, Healthcare Power of [place].  Medication History Aleve (220MG  Capsule, 1 (one) Oral) Active. Omega-3 Fish Oil (1000MG  Capsule, Oral) Active. Glucosamine Chondr 1500 Complx (Oral) Specific strength unknown - Active. Vit D Active. Vit B12 Active.  Past Surgical History  Arthroscopy of Knee  right Arthroscopy of Shoulder  right Colon Polyp Removal - Colonoscopy  Inguinal Hernia Repair  open: right Other Surgery  knee reconstruction - right Rotator Cuff Repair  right Tonsillectomy   Review of Systems  General Present- Night Sweats. Not Present- Chills, Fatigue, Fever, Memory Loss, Weight Gain and Weight Loss. Skin Not Present- Eczema, Hives, Itching, Lesions and Rash. HEENT Not Present- Dentures, Double Vision, Headache, Hearing Loss, Tinnitus and Visual Loss. Respiratory Not Present- Allergies, Chronic Cough, Coughing up blood, Shortness of breath at rest and Shortness of breath with exertion. Cardiovascular Not Present- Chest Pain, Difficulty Breathing Lying Down, Murmur, Palpitations, Racing/skipping heartbeats and Swelling. Gastrointestinal Not Present- Abdominal Pain, Bloody Stool, Constipation, Diarrhea,  Difficulty Swallowing, Heartburn, Jaundice, Loss of appetitie,  Nausea and Vomiting. Male Genitourinary Present- Urinating at Night. Not Present- Blood in Urine, Discharge, Flank Pain, Incontinence, Painful Urination, Urgency, Urinary frequency, Urinary Retention and Weak urinary stream. Musculoskeletal Present- Joint Pain and Joint Swelling. Not Present- Back Pain, Morning Stiffness, Muscle Pain, Muscle Weakness and Spasms. Neurological Not Present- Blackout spells, Difficulty with balance, Dizziness, Paralysis, Tremor and Weakness. Psychiatric Not Present- Insomnia.  Vitals Weight: 185 lb Height: 72in Weight was reported by patient. Height was reported by patient. Body Surface Area: 2.06 m Body Mass Index: 25.09 kg/m  Pulse: 76 (Regular)  BP: 130/84 (Sitting, Right Arm, Standard)   Physical Exam  General Mental Status -Alert, cooperative and good historian. General Appearance-pleasant, Not in acute distress. Orientation-Oriented X3. Build & Nutrition-Well nourished and Well developed.  Head and Neck Head-normocephalic, atraumatic . Neck Global Assessment - supple, no bruit auscultated on the right, no bruit auscultated on the left.  Eye Pupil - Bilateral-Regular and Round. Motion - Bilateral-EOMI.  Chest and Lung Exam Auscultation Breath sounds - clear at anterior chest wall and clear at posterior chest wall. Adventitious sounds - Expiratory wheeze - Left Lower Lobe (Posterior)(does clear with coughing).  Cardiovascular Auscultation Rhythm - Regular rate and rhythm. Heart Sounds - S1 WNL and S2 WNL. Murmurs & Other Heart Sounds - Auscultation of the heart reveals - No Murmurs.  Abdomen Palpation/Percussion Tenderness - Abdomen is non-tender to palpation. Rigidity (guarding) - Abdomen is soft. Auscultation Auscultation of the abdomen reveals - Bowel sounds normal.  Male Genitourinary Note: Not done, not pertinent to present illness   Musculoskeletal Note: His LEFT knee shows trace effusion. His range  of motion is 10-120. There is marked crepitus on range of motion of the LEFT knee. He has significant varus deformity. He has significant varus thrust with this leg. There is significant varus valgus and anterior posterior laxity of the knee.    Radiographs AP and lateral of the LEFT knee show severe end-stage arthritis bone-on-bone all 3 compartments with significant varus deformity and significant tibial subluxation.   Assessment & Plan  Primary osteoarthritis of both knees (M17.0)  Note:Surgical Plans: Left Total Knee Replacement  Disposition: Home, Straight [facility] 08/04/17  PCP: Dr. Caryl NeverBurchette  IV TXA  Anesthesia Issues: None  Patient was instructed on what medications to stop prior to surgery.  Signed electronically by Lauraine RinneAlexzandrew L Perkins, III PA-C

## 2017-07-25 NOTE — Patient Instructions (Signed)
Miguel Benjamin  Benjamin   Your procedure is scheduled on: 08-01-17  Report to [facility] Main  Entrance Follow signs to Short Stay on first floor at 5:30 AM    Call this number if you have problems the morning of surgery  [phone]   Remember: ONLY 1 PERSON MAY GO WITH YOU TO SHORT STAY TO GET  READY MORNING OF YOUR SURGERY.  Do not eat food or drink liquids :After Midnight.     Take these medicines the morning of surgery with A SIP OF WATER: None                                You may not have any metal on your body including hair pins and              piercings  Do not wear jewelry, make-up, lotions, powders or deodorant             Men may shave face and neck.   Do not bring valuables to the hospital. [facility] IS NOT             RESPONSIBLE   FOR VALUABLES.  Contacts, dentures or bridgework may not be worn into surgery.  Leave suitcase in the car. After surgery it may be brought to your room.                Please read over the following fact sheets you were given: _____________________________________________________________________             [facility] - Preparing for Surgery Before surgery, you can play an important role.  Because skin is not sterile, your skin needs to be as free of germs as possible.  You can reduce the number of germs on your skin by washing with CHG (chlorahexidine gluconate) soap before surgery.  CHG is an antiseptic cleaner which kills germs and bonds with the skin to continue killing germs even after washing. Please DO NOT use if you have an allergy to CHG or antibacterial soaps.  If your skin becomes reddened/irritated stop using the CHG and inform your nurse when you arrive at Short Stay. Do not shave (including legs and underarms) for at least 48 hours prior to the first CHG shower.  You may shave your face/neck. Please follow these instructions carefully:  1.  Shower with CHG Soap the night before surgery  and the  morning of Surgery.  2.  If you choose to wash your hair, wash your hair first as usual with your  normal  shampoo.  3.  After you shampoo, rinse your hair and body thoroughly to remove the  shampoo.                           4.  Use CHG as you would any other liquid soap.  You can apply chg directly  to the skin and wash                       Gently with a scrungie or clean washcloth.  5.  Apply the CHG Soap to your body ONLY FROM THE NECK DOWN.   Do not use on face/ open  Wound or open sores. Avoid contact with eyes, ears mouth and genitals (private parts).                       Wash face,  Genitals (private parts) with your normal soap.             6.  Wash thoroughly, paying special attention to the area where your surgery  will be performed.  7.  Thoroughly rinse your body with warm water from the neck down.  8.  DO NOT shower/wash with your normal soap after using and rinsing off  the CHG Soap.                9.  Pat yourself dry with a clean towel.            10.  Wear clean pajamas.            11.  Place clean sheets on your bed the night of your first shower and do not  sleep with pets. Day of Surgery : Do not apply any lotions/deodorants the morning of surgery.  Please wear clean clothes to the hospital/surgery center.  FAILURE TO FOLLOW THESE INSTRUCTIONS MAY RESULT IN THE CANCELLATION OF YOUR SURGERY PATIENT SIGNATURE_________________________________  NURSE SIGNATURE__________________________________  ________________________________________________________________________   Miguel Benjamin  An incentive spirometer is a tool that can help keep your lungs clear and active. This tool measures how well you are filling your lungs with each breath. Taking long deep breaths may help reverse or decrease the chance of developing breathing (pulmonary) problems (especially infection) following:  A long period of time when you are unable to move or  be active. BEFORE THE PROCEDURE   If the spirometer includes an indicator to show your best effort, your nurse or respiratory therapist will set it to a desired goal.  If possible, sit up straight or lean slightly forward. Try not to slouch.  Hold the incentive spirometer in an upright position. INSTRUCTIONS FOR USE  1. Sit on the edge of your bed if possible, or sit up as far as you can in bed or on a chair. 2. Hold the incentive spirometer in an upright position. 3. Breathe out normally. 4. Place the mouthpiece in your mouth and seal your lips tightly around it. 5. Breathe in slowly and as deeply as possible, raising the piston or the ball toward the top of the column. 6. Hold your breath for 3-5 seconds or for as long as possible. Allow the piston or ball to fall to the bottom of the column. 7. Remove the mouthpiece from your mouth and breathe out normally. 8. Rest for a few seconds and repeat Steps 1 through 7 at least 10 times every 1-2 hours when you are awake. Take your time and take a few normal breaths between deep breaths. 9. The spirometer may include an indicator to show your best effort. Use the indicator as a goal to work toward during each repetition. 10. After each set of 10 deep breaths, practice coughing to be sure your lungs are clear. If you have an incision (the cut made at the time of surgery), support your incision when coughing by placing a pillow or rolled up towels firmly against it. Once you are able to get out of bed, walk around indoors and cough well. You may stop using the incentive spirometer when instructed by your caregiver.  RISKS AND COMPLICATIONS  Take your time so you do not get  dizzy or light-headed.  If you are in pain, you may need to take or ask for pain medication before doing incentive spirometry. It is harder to take a deep breath if you are having pain. AFTER USE  Rest and breathe slowly and easily.  It can be helpful to keep track of a log  of your progress. Your caregiver can provide you with a simple table to help with this. If you are using the spirometer at home, follow these instructions: [place] IF:   You are having difficultly using the spirometer.  You have trouble using the spirometer as often as instructed.  Your pain medication is not giving enough relief while using the spirometer.  You develop fever of 100.5 F (38.1 C) or higher. SEEK IMMEDIATE MEDICAL CARE IF:   You cough up bloody sputum that had not been present before.  You develop fever of 102 F (38.9 C) or greater.  You develop worsening pain at or near the incision site. MAKE SURE YOU:   Understand these instructions.  Will watch your condition.  Will get help right away if you are not doing well or get worse. Document Released: 12/20/2006 Document Revised: 11/01/2011 Document Reviewed: 02/20/2007 ExitCare Patient Information 2014 ExitCare, [place].   ________________________________________________________________________  WHAT IS A BLOOD TRANSFUSION? Blood Transfusion Information  A transfusion is the replacement of blood or some of its parts. Blood is made up of multiple cells which provide different functions.  Red blood cells carry oxygen and are used for blood loss replacement.  White blood cells fight against infection.  Platelets control bleeding.  Plasma helps clot blood.  Other blood products are available for specialized needs, such as hemophilia or other clotting disorders. BEFORE THE TRANSFUSION  Who gives blood for transfusions?   Healthy volunteers who are fully evaluated to make sure their blood is safe. This is blood bank blood. Transfusion therapy is the safest it has ever been in the practice of medicine. Before blood is taken from a donor, a complete history is taken to make sure that person has no history of diseases nor engages in risky social behavior (examples are intravenous drug use or sexual  activity with multiple partners). The donor's travel history is screened to minimize risk of transmitting infections, such as malaria. The donated blood is tested for signs of infectious diseases, such as HIV and hepatitis. The blood is then tested to be sure it is compatible with you in order to minimize the chance of a transfusion reaction. If you or a relative donates blood, this is often done in anticipation of surgery and is not appropriate for emergency situations. It takes many days to process the donated blood. RISKS AND COMPLICATIONS Although transfusion therapy is very safe and saves many lives, the main dangers of transfusion include:   Getting an infectious disease.  Developing a transfusion reaction. This is an allergic reaction to something in the blood you were given. Every precaution is taken to prevent this. The decision to have a blood transfusion has been considered carefully by your caregiver before blood is given. Blood is not given unless the benefits outweigh the risks. AFTER THE TRANSFUSION  Right after receiving a blood transfusion, you will usually feel much better and more energetic. This is especially true if your red blood cells have gotten low (anemic). The transfusion raises the level of the red blood cells which carry oxygen, and this usually causes an energy increase.  The nurse administering the transfusion will  monitor you carefully for complications. HOME CARE INSTRUCTIONS  No special instructions are needed after a transfusion. You may find your energy is better. Speak with your caregiver about any limitations on activity for underlying diseases you may have. SEEK MEDICAL CARE IF:   Your condition is not improving after your transfusion.  You develop redness or irritation at the intravenous (IV) site. SEEK IMMEDIATE MEDICAL CARE IF:  Any of the following symptoms occur over the next 12 hours:  Shaking chills.  You have a temperature by mouth above 102 F  (38.9 C), not controlled by medicine.  Chest, back, or muscle pain.  People around you feel you are not acting correctly or are confused.  Shortness of breath or difficulty breathing.  Dizziness and fainting.  You get a rash or develop hives.  You have a decrease in urine output.  Your urine turns a dark color or changes to pink, red, or brown. Any of the following symptoms occur over the next 10 days:  You have a temperature by mouth above 102 F (38.9 C), not controlled by medicine.  Shortness of breath.  Weakness after normal activity.  The white part of the eye turns yellow (jaundice).  You have a decrease in the amount of urine or are urinating less often.  Your urine turns a dark color or changes to pink, red, or brown. Document Released: 08/06/2000 Document Revised: 11/01/2011 Document Reviewed: 03/25/2008 [facility] Patient Information 2014 [place], [place].  _______________________________________________________________________

## 2017-07-25 NOTE — Progress Notes (Signed)
05-10-17 (Epic) EKG

## 2017-07-27 ENCOUNTER — Other Ambulatory Visit: Payer: Self-pay

## 2017-07-27 ENCOUNTER — Encounter (HOSPITAL_COMMUNITY)
Admission: RE | Admit: 2017-07-27 | Discharge: 2017-07-27 | Disposition: A | Discharge status: Home / Self Care | Payer: Managed Care, Other (non HMO) | Source: Ambulatory Visit | Attending: Orthopedic Surgery | Admitting: Orthopedic Surgery

## 2017-07-27 ENCOUNTER — Encounter (HOSPITAL_COMMUNITY): Payer: Self-pay

## 2017-07-27 DIAGNOSIS — M1712 Unilateral primary osteoarthritis, left knee: Secondary | ICD-10-CM | POA: Insufficient documentation

## 2017-07-27 DIAGNOSIS — Z01812 Encounter for preprocedural laboratory examination: Secondary | ICD-10-CM | POA: Diagnosis not present

## 2017-07-27 LAB — COMPREHENSIVE METABOLIC PANEL
ALBUMIN: 4.1 g/dL (ref 3.5–5.0)
ALT: 22 U/L (ref 17–63)
ANION GAP: 7 (ref 5–15)
AST: 21 U/L (ref 15–41)
Alkaline Phosphatase: 68 U/L (ref 38–126)
BUN: 20 mg/dL (ref 6–20)
CHLORIDE: 104 mmol/L (ref 101–111)
CO2: 28 mmol/L (ref 22–32)
Calcium: 9.4 mg/dL (ref 8.9–10.3)
Creatinine, Ser: 0.98 mg/dL (ref 0.61–1.24)
GFR calc Af Amer: >60 mL/min (ref >60)
GFR calc non Af Amer: >60 mL/min (ref >60)
GLUCOSE: 101 mg/dL — AB (ref 65–99)
POTASSIUM: 4.6 mmol/L (ref 3.5–5.1)
SODIUM: 139 mmol/L (ref 135–145)
TOTAL PROTEIN: 6.8 g/dL (ref 6.5–8.1)
Total Bilirubin: 0.5 mg/dL (ref 0.3–1.2)

## 2017-07-27 LAB — CBC
HEMATOCRIT: 44.6 % (ref 39.0–52.0)
Hemoglobin: 15.3 g/dL (ref 13.0–17.0)
MCH: 31.5 pg (ref 26.0–34.0)
MCHC: 34.3 g/dL (ref 30.0–36.0)
MCV: 91.8 fL (ref 78.0–100.0)
PLATELETS: 246 10*3/uL (ref 150–400)
RBC: 4.86 MIL/uL (ref 4.22–5.81)
RDW: 13.2 % (ref 11.5–15.5)
WBC: 8.9 10*3/uL (ref 4.0–10.5)

## 2017-07-27 LAB — ABO/RH: ABO/RH(D): O POS

## 2017-07-27 LAB — APTT: APTT: 30 s (ref 24–36)

## 2017-07-27 LAB — SURGICAL PCR SCREEN
MRSA, PCR: NEGATIVE
STAPHYLOCOCCUS AUREUS: NEGATIVE

## 2017-07-27 LAB — PROTIME-INR
INR: 0.87
Prothrombin Time: 11.8 seconds (ref 11.4–15.2)

## 2017-07-31 ENCOUNTER — Ambulatory Visit: Payer: Self-pay | Admitting: Orthopedic Surgery

## 2017-07-31 NOTE — H&P (Signed)
Miguel Benjamin DOB: [birth year] Married / Language: English / Race: White Male Date of Admission:  08/01/17 CC:  Left knee pain History of Present Illness  The patient is a 61 year old male who comes in for a preoperative History and Physical. The patient is scheduled for a left total knee arthroplasty to be performed by Dr. Gus RankinFrank V. Aluisio, MD at [facility] on 08-01-2017. The patient is a 61 year old male who presented for follow up of their hip. The patient is being followed for their left hip pain. They are month(s) out from symptoms worsening. Symptoms reported include: pain and difficulty ambulating. Current treatment includes: use of a cane. The following medication has been used for pain control: antiinflammatory medication (Aleve bid). Miguel Benjamin is having significant pain and significant dysfunction in his LEFT knee. He has had end-stage arthritis any for many years and now is at a point where he feels like he needs to get effects. It is hurting at all times. He is having marked functional limitations also. He has had multiple injections in the past and they are no longer beneficial. He is now ready to proceed with surgery. They have been treated conservatively in the past for the above stated problem and despite conservative measures, they continue to have progressive pain and severe functional limitations and dysfunction. They have failed non-operative management including home exercise, medications, and injections. It is felt that they would benefit from undergoing total joint replacement. Risks and benefits of the procedure have been discussed with the patient and they elect to proceed with surgery. There are no active contraindications to surgery such as ongoing infection or rapidly progressive neurological disease.    Problem List/Past Medical  Primary osteoarthritis of both knees (M17.0)  COPD  mild   Allergies  No Known Drug Allergies   Family History Cancer   father Heart Disease  father  Social History Alcohol use  current drinker; drinks beer, wine and hard liquor; less than 5 per week Current work status  working full time Drug/Alcohol Rehab (Currently)  no Drug/Alcohol Rehab (Previously)  no Exercise  Exercises weekly; does individual sport and team sport Illicit drug use  no Living situation  live with spouse Marital status  married Number of flights of stairs before winded  greater than 5 Pain Contract  no Tobacco / smoke exposure  no Tobacco use  current some days smoker; smoke(d) 3/4 pack(s) per day Post-Surgical Plans  Home With Family, Home, Straight to Outpatient Therapy. Advance Directives  Living Will, Healthcare Power of [place].  Medication History Aleve (220MG  Capsule, 1 (one) Oral) Active. Omega-3 Fish Oil (1000MG  Capsule, Oral) Active. Glucosamine Chondr 1500 Complx (Oral) Specific strength unknown - Active. Vit D Active. Vit B12 Active.  Past Surgical History Arthroscopy of Knee  right Arthroscopy of Shoulder  right Colon Polyp Removal - Colonoscopy  Inguinal Hernia Repair  open: right Other Surgery  knee reconstruction - right Rotator Cuff Repair  right Tonsillectomy   Review of Systems General Present- Night Sweats. Not Present- Chills, Fatigue, Fever, Memory Loss, Weight Gain and Weight Loss. Skin Not Present- Eczema, Hives, Itching, Lesions and Rash. HEENT Not Present- Dentures, Double Vision, Headache, Hearing Loss, Tinnitus and Visual Loss. Respiratory Not Present- Allergies, Chronic Cough, Coughing up blood, Shortness of breath at rest and Shortness of breath with exertion. Cardiovascular Not Present- Chest Pain, Difficulty Breathing Lying Down, Murmur, Palpitations, Racing/skipping heartbeats and Swelling. Gastrointestinal Not Present- Abdominal Pain, Bloody Stool, Constipation, Diarrhea, Difficulty Swallowing,  Heartburn, Jaundice, Loss of appetitie, Nausea and Vomiting. Male  Genitourinary Present- Urinating at Night. Not Present- Blood in Urine, Discharge, Flank Pain, Incontinence, Painful Urination, Urgency, Urinary frequency, Urinary Retention and Weak urinary stream. Musculoskeletal Present- Joint Pain and Joint Swelling. Not Present- Back Pain, Morning Stiffness, Muscle Pain, Muscle Weakness and Spasms. Neurological Not Present- Blackout spells, Difficulty with balance, Dizziness, Paralysis, Tremor and Weakness. Psychiatric Not Present- Insomnia.  Vitals  Weight: 185 lb Height: 72in Weight was reported by patient. Height was reported by patient. Body Surface Area: 2.06 m Body Mass Index: 25.09 kg/m  Pulse: 76 (Regular)  BP: 130/84 (Sitting, Right Arm, Standard)       Physical Exam General Mental Status -Alert, cooperative and good historian. General Appearance-pleasant, Not in acute distress. Orientation-Oriented X3. Build & Nutrition-Well nourished and Well developed.  Head and Neck Head-normocephalic, atraumatic . Neck Global Assessment - supple, no bruit auscultated on the right, no bruit auscultated on the left.  Eye Pupil - Bilateral-Regular and Round. Motion - Bilateral-EOMI.  Chest and Lung Exam Auscultation Breath sounds - clear at anterior chest wall and clear at posterior chest wall. Adventitious sounds - Expiratory wheeze - Left Lower Lobe (Posterior)(does clear with coughing).  Cardiovascular Auscultation Rhythm - Regular rate and rhythm. Heart Sounds - S1 WNL and S2 WNL. Murmurs & Other Heart Sounds - Auscultation of the heart reveals - No Murmurs.  Abdomen Palpation/Percussion Tenderness - Abdomen is non-tender to palpation. Rigidity (guarding) - Abdomen is soft. Auscultation Auscultation of the abdomen reveals - Bowel sounds normal.  Male Genitourinary Note: Not done, not pertinent to present illness   Musculoskeletal Note: His LEFT knee shows trace effusion. His range of motion is  10-120. There is marked crepitus on range of motion of the LEFT knee. He has significant varus deformity. He has significant varus thrust with this leg. There is significant varus valgus and anterior posterior laxity of the knee.    Radiographs AP and lateral of the LEFT knee show severe end-stage arthritis bone-on-bone all 3 compartments with significant varus deformity and significant tibial subluxation.   Assessment & Plan Primary osteoarthritis of both knees (M17.0)  Note:Surgical Plans: Left Total Knee Replacement  Disposition: Home, Straight to [facility] on Thursday 08/04/17  PCP: Dr. Caryl NeverBurchette  IV TXA  Anesthesia Issues: None  Patient was instructed on what medications to stop prior to surgery.  Signed electronically by Lauraine RinneAlexzandrew L Perkins, III PA-C

## 2017-08-01 ENCOUNTER — Other Ambulatory Visit: Payer: Self-pay

## 2017-08-01 ENCOUNTER — Inpatient Hospital Stay (HOSPITAL_COMMUNITY)
Admission: RE | Admit: 2017-08-01 | Discharge: 2017-08-03 | DRG: 470 | Disposition: A | Discharge status: Home / Self Care | Payer: Managed Care, Other (non HMO) | Source: Ambulatory Visit | Attending: Orthopedic Surgery | Admitting: Orthopedic Surgery

## 2017-08-01 ENCOUNTER — Inpatient Hospital Stay (HOSPITAL_COMMUNITY): Payer: Managed Care, Other (non HMO) | Admitting: Registered Nurse

## 2017-08-01 ENCOUNTER — Encounter (HOSPITAL_COMMUNITY): Payer: Self-pay | Admitting: *Deleted

## 2017-08-01 ENCOUNTER — Encounter (HOSPITAL_COMMUNITY)
Admission: RE | Disposition: A | Discharge status: Home / Self Care | Payer: Self-pay | Source: Ambulatory Visit | Attending: Orthopedic Surgery

## 2017-08-01 DIAGNOSIS — Z791 Long term (current) use of non-steroidal anti-inflammatories (NSAID): Secondary | ICD-10-CM | POA: Diagnosis not present

## 2017-08-01 DIAGNOSIS — M1710 Unilateral primary osteoarthritis, unspecified knee: Secondary | ICD-10-CM

## 2017-08-01 DIAGNOSIS — F17210 Nicotine dependence, cigarettes, uncomplicated: Secondary | ICD-10-CM | POA: Diagnosis present

## 2017-08-01 DIAGNOSIS — M21162 Varus deformity, not elsewhere classified, left knee: Secondary | ICD-10-CM | POA: Diagnosis present

## 2017-08-01 DIAGNOSIS — Z8249 Family history of ischemic heart disease and other diseases of the circulatory system: Secondary | ICD-10-CM

## 2017-08-01 DIAGNOSIS — Z86010 Personal history of colonic polyps: Secondary | ICD-10-CM

## 2017-08-01 DIAGNOSIS — M170 Bilateral primary osteoarthritis of knee: Principal | ICD-10-CM | POA: Diagnosis present

## 2017-08-01 DIAGNOSIS — M179 Osteoarthritis of knee, unspecified: Secondary | ICD-10-CM

## 2017-08-01 DIAGNOSIS — J449 Chronic obstructive pulmonary disease, unspecified: Secondary | ICD-10-CM | POA: Diagnosis present

## 2017-08-01 DIAGNOSIS — M25762 Osteophyte, left knee: Secondary | ICD-10-CM | POA: Diagnosis present

## 2017-08-01 DIAGNOSIS — Z79899 Other long term (current) drug therapy: Secondary | ICD-10-CM

## 2017-08-01 HISTORY — PX: TOTAL KNEE ARTHROPLASTY: SHX125

## 2017-08-01 LAB — TYPE AND SCREEN
ABO/RH(D): O POS
ANTIBODY SCREEN: NEGATIVE

## 2017-08-01 SURGERY — ARTHROPLASTY, KNEE, TOTAL
Anesthesia: Spinal | Site: Knee | Laterality: Left

## 2017-08-01 MED ORDER — 0.9 % SODIUM CHLORIDE (POUR BTL) OPTIME
TOPICAL | Status: DC | PRN
  Administered 2017-08-01: 1000 mL

## 2017-08-01 MED ORDER — PROPOFOL 10 MG/ML IV BOLUS
INTRAVENOUS | Status: AC
  Filled 2017-08-01: qty 40

## 2017-08-01 MED ORDER — MENTHOL 3 MG MT LOZG: 1.0000 | LOZENGE | OROMUCOSAL | Status: DC | PRN

## 2017-08-01 MED ORDER — FENTANYL CITRATE (PF) 100 MCG/2ML IJ SOLN
INTRAMUSCULAR | Status: AC
  Filled 2017-08-01: qty 2

## 2017-08-01 MED ORDER — EPHEDRINE 5 MG/ML INJ
INTRAVENOUS | Status: AC
  Filled 2017-08-01: qty 10

## 2017-08-01 MED ORDER — ONDANSETRON HCL 4 MG/2ML IJ SOLN: 4.0000 mg | Freq: Four times a day (QID) | INTRAMUSCULAR | Status: DC | PRN

## 2017-08-01 MED ORDER — PROPOFOL 10 MG/ML IV BOLUS
INTRAVENOUS | Status: AC
  Filled 2017-08-01: qty 20

## 2017-08-01 MED ORDER — GABAPENTIN 300 MG PO CAPS
300.0000 mg | ORAL_CAPSULE | Freq: Once | ORAL | Status: AC
  Administered 2017-08-01: 300 mg via ORAL

## 2017-08-01 MED ORDER — MIDAZOLAM HCL 2 MG/2ML IJ SOLN
INTRAMUSCULAR | Status: AC
  Filled 2017-08-01: qty 2

## 2017-08-01 MED ORDER — OXYCODONE HCL 5 MG PO TABS
10.0000 mg | ORAL_TABLET | ORAL | Status: DC | PRN
  Administered 2017-08-01 – 2017-08-03 (×16): 10 mg via ORAL
  Filled 2017-08-01 (×16): qty 2

## 2017-08-01 MED ORDER — ACETAMINOPHEN 10 MG/ML IV SOLN
1000.0000 mg | Freq: Once | INTRAVENOUS | Status: AC
  Administered 2017-08-01: 1000 mg via INTRAVENOUS

## 2017-08-01 MED ORDER — BUPIVACAINE LIPOSOME 1.3 % IJ SUSP
INTRAMUSCULAR | Status: DC | PRN
  Administered 2017-08-01 (×2): 10 mL

## 2017-08-01 MED ORDER — CEFAZOLIN SODIUM-DEXTROSE 2-4 GM/100ML-% IV SOLN
2.0000 g | INTRAVENOUS | Status: AC
  Administered 2017-08-01: 2 g via INTRAVENOUS

## 2017-08-01 MED ORDER — POLYETHYLENE GLYCOL 3350 17 G PO PACK: 17.0000 g | PACK | Freq: Every day | ORAL | Status: DC | PRN

## 2017-08-01 MED ORDER — ONDANSETRON HCL 4 MG/2ML IJ SOLN
INTRAMUSCULAR | Status: AC
  Filled 2017-08-01: qty 2

## 2017-08-01 MED ORDER — DEXAMETHASONE SODIUM PHOSPHATE 10 MG/ML IJ SOLN
10.0000 mg | Freq: Once | INTRAMUSCULAR | Status: AC
  Administered 2017-08-01: 10 mg via INTRAVENOUS

## 2017-08-01 MED ORDER — CHLORHEXIDINE GLUCONATE 4 % EX LIQD: 60.0000 mL | Freq: Once | CUTANEOUS | Status: DC

## 2017-08-01 MED ORDER — METHOCARBAMOL 500 MG PO TABS
500.0000 mg | ORAL_TABLET | Freq: Four times a day (QID) | ORAL | Status: DC | PRN
  Administered 2017-08-01 – 2017-08-03 (×5): 500 mg via ORAL
  Filled 2017-08-01 (×5): qty 1

## 2017-08-01 MED ORDER — PHENOL 1.4 % MT LIQD
1.0000 | OROMUCOSAL | Status: DC | PRN
  Filled 2017-08-01: qty 177

## 2017-08-01 MED ORDER — DOCUSATE SODIUM 100 MG PO CAPS
100.0000 mg | ORAL_CAPSULE | Freq: Two times a day (BID) | ORAL | Status: DC
  Administered 2017-08-01 – 2017-08-03 (×4): 100 mg via ORAL
  Filled 2017-08-01 (×4): qty 1

## 2017-08-01 MED ORDER — ONDANSETRON HCL 4 MG PO TABS: 4.0000 mg | ORAL_TABLET | Freq: Four times a day (QID) | ORAL | Status: DC | PRN

## 2017-08-01 MED ORDER — BISACODYL 10 MG RE SUPP: 10.0000 mg | Freq: Every day | RECTAL | Status: DC | PRN

## 2017-08-01 MED ORDER — SODIUM CHLORIDE 0.9 % IJ SOLN
INTRAMUSCULAR | Status: DC | PRN
  Administered 2017-08-01 (×2): 30 mL

## 2017-08-01 MED ORDER — LIDOCAINE 2% (20 MG/ML) 5 ML SYRINGE
INTRAMUSCULAR | Status: DC | PRN
  Administered 2017-08-01: 100 mg via INTRAVENOUS

## 2017-08-01 MED ORDER — FENTANYL CITRATE (PF) 100 MCG/2ML IJ SOLN
INTRAMUSCULAR | Status: DC | PRN
  Administered 2017-08-01 (×2): 50 ug via INTRAVENOUS

## 2017-08-01 MED ORDER — SODIUM CHLORIDE 0.9 % IR SOLN
Status: DC | PRN
  Administered 2017-08-01: 1000 mL

## 2017-08-01 MED ORDER — ACETAMINOPHEN 500 MG PO TABS
1000.0000 mg | ORAL_TABLET | Freq: Four times a day (QID) | ORAL | Status: AC
  Administered 2017-08-01 – 2017-08-02 (×4): 1000 mg via ORAL
  Filled 2017-08-01 (×5): qty 2

## 2017-08-01 MED ORDER — ROPIVACAINE HCL 5 MG/ML IJ SOLN
INTRAMUSCULAR | Status: DC | PRN
  Administered 2017-08-01: 20 mL via PERINEURAL

## 2017-08-01 MED ORDER — DEXAMETHASONE SODIUM PHOSPHATE 10 MG/ML IJ SOLN
10.0000 mg | Freq: Once | INTRAMUSCULAR | Status: AC
  Administered 2017-08-02: 09:00:00 10 mg via INTRAVENOUS
  Filled 2017-08-01: qty 1

## 2017-08-01 MED ORDER — DEXAMETHASONE SODIUM PHOSPHATE 10 MG/ML IJ SOLN
INTRAMUSCULAR | Status: AC
  Filled 2017-08-01: qty 1

## 2017-08-01 MED ORDER — TRANEXAMIC ACID 1000 MG/10ML IV SOLN
1000.0000 mg | Freq: Once | INTRAVENOUS | Status: AC
  Administered 2017-08-01: 11:00:00 1000 mg via INTRAVENOUS
  Filled 2017-08-01: qty 1100

## 2017-08-01 MED ORDER — STERILE WATER FOR IRRIGATION IR SOLN
Status: DC | PRN
  Administered 2017-08-01: 2000 mL

## 2017-08-01 MED ORDER — METOCLOPRAMIDE HCL 5 MG/ML IJ SOLN: 5.0000 mg | Freq: Three times a day (TID) | INTRAMUSCULAR | Status: DC | PRN

## 2017-08-01 MED ORDER — ACETAMINOPHEN 325 MG PO TABS
650.0000 mg | ORAL_TABLET | ORAL | Status: DC | PRN
  Administered 2017-08-03: 09:00:00 650 mg via ORAL
  Filled 2017-08-01: qty 2

## 2017-08-01 MED ORDER — TRANEXAMIC ACID 1000 MG/10ML IV SOLN
1000.0000 mg | INTRAVENOUS | Status: AC
  Administered 2017-08-01: 1000 mg via INTRAVENOUS
  Filled 2017-08-01: qty 1100

## 2017-08-01 MED ORDER — BUPIVACAINE LIPOSOME 1.3 % IJ SUSP
20.0000 mL | Freq: Once | INTRAMUSCULAR | Status: DC
  Filled 2017-08-01: qty 20

## 2017-08-01 MED ORDER — SODIUM CHLORIDE 0.9 % IJ SOLN
INTRAMUSCULAR | Status: AC
  Filled 2017-08-01: qty 10

## 2017-08-01 MED ORDER — EPHEDRINE SULFATE-NACL 50-0.9 MG/10ML-% IV SOSY
PREFILLED_SYRINGE | INTRAVENOUS | Status: DC | PRN
  Administered 2017-08-01: 10 mg via INTRAVENOUS
  Administered 2017-08-01 (×3): 5 mg via INTRAVENOUS

## 2017-08-01 MED ORDER — CEFAZOLIN SODIUM-DEXTROSE 2-4 GM/100ML-% IV SOLN
2.0000 g | Freq: Four times a day (QID) | INTRAVENOUS | Status: AC
  Administered 2017-08-01 (×2): 2 g via INTRAVENOUS
  Filled 2017-08-01 (×2): qty 100

## 2017-08-01 MED ORDER — LACTATED RINGERS IV SOLN
INTRAVENOUS | Status: DC
  Administered 2017-08-01: 06:00:00 via INTRAVENOUS

## 2017-08-01 MED ORDER — NICOTINE 21 MG/24HR TD PT24
21.0000 mg | MEDICATED_PATCH | Freq: Every day | TRANSDERMAL | Status: DC
  Administered 2017-08-02 – 2017-08-03 (×2): 21 mg via TRANSDERMAL
  Filled 2017-08-01 (×2): qty 1

## 2017-08-01 MED ORDER — DIPHENHYDRAMINE HCL 12.5 MG/5ML PO ELIX: 12.5000 mg | ORAL_SOLUTION | ORAL | Status: DC | PRN

## 2017-08-01 MED ORDER — BUPIVACAINE IN DEXTROSE 0.75-8.25 % IT SOLN
INTRATHECAL | Status: DC | PRN
  Administered 2017-08-01: 2 mL via INTRATHECAL

## 2017-08-01 MED ORDER — TRAMADOL HCL 50 MG PO TABS: 50.0000 mg | ORAL_TABLET | Freq: Four times a day (QID) | ORAL | Status: DC | PRN

## 2017-08-01 MED ORDER — PROPOFOL 500 MG/50ML IV EMUL
INTRAVENOUS | Status: DC | PRN
  Administered 2017-08-01: 50 ug/kg/min via INTRAVENOUS

## 2017-08-01 MED ORDER — MORPHINE SULFATE (PF) 4 MG/ML IV SOLN
2.0000 mg | INTRAVENOUS | Status: DC | PRN
  Administered 2017-08-01 – 2017-08-02 (×4): 2 mg via INTRAVENOUS
  Filled 2017-08-01 (×4): qty 1

## 2017-08-01 MED ORDER — METOCLOPRAMIDE HCL 5 MG PO TABS: 5.0000 mg | ORAL_TABLET | Freq: Three times a day (TID) | ORAL | Status: DC | PRN

## 2017-08-01 MED ORDER — SODIUM CHLORIDE 0.9 % IJ SOLN
INTRAMUSCULAR | Status: AC
  Filled 2017-08-01: qty 50

## 2017-08-01 MED ORDER — LIDOCAINE 2% (20 MG/ML) 5 ML SYRINGE
INTRAMUSCULAR | Status: AC
  Filled 2017-08-01: qty 5

## 2017-08-01 MED ORDER — GABAPENTIN 300 MG PO CAPS
ORAL_CAPSULE | ORAL | Status: AC
  Filled 2017-08-01: qty 1

## 2017-08-01 MED ORDER — FLEET ENEMA 7-19 GM/118ML RE ENEM: 1.0000 | ENEMA | Freq: Once | RECTAL | Status: DC | PRN

## 2017-08-01 MED ORDER — RIVAROXABAN 10 MG PO TABS
10.0000 mg | ORAL_TABLET | Freq: Every day | ORAL | Status: DC
  Administered 2017-08-02 – 2017-08-03 (×2): 10 mg via ORAL
  Filled 2017-08-01 (×2): qty 1

## 2017-08-01 MED ORDER — CEFAZOLIN SODIUM-DEXTROSE 2-4 GM/100ML-% IV SOLN
INTRAVENOUS | Status: AC
  Filled 2017-08-01: qty 100

## 2017-08-01 MED ORDER — ONDANSETRON HCL 4 MG/2ML IJ SOLN
INTRAMUSCULAR | Status: DC | PRN
  Administered 2017-08-01: 4 mg via INTRAVENOUS

## 2017-08-01 MED ORDER — MIDAZOLAM HCL 5 MG/5ML IJ SOLN
INTRAMUSCULAR | Status: DC | PRN
  Administered 2017-08-01: 2 mg via INTRAVENOUS

## 2017-08-01 MED ORDER — OXYCODONE HCL 5 MG PO TABS
5.0000 mg | ORAL_TABLET | ORAL | Status: DC | PRN
  Administered 2017-08-01: 5 mg via ORAL
  Filled 2017-08-01: qty 1

## 2017-08-01 MED ORDER — SODIUM CHLORIDE 0.9 % IV SOLN
INTRAVENOUS | Status: DC
  Administered 2017-08-01: 23:00:00 via INTRAVENOUS
  Administered 2017-08-01: 10:00:00 100 mL/h via INTRAVENOUS

## 2017-08-01 MED ORDER — ACETAMINOPHEN 650 MG RE SUPP: 650.0000 mg | RECTAL | Status: DC | PRN

## 2017-08-01 MED ORDER — ACETAMINOPHEN 10 MG/ML IV SOLN
INTRAVENOUS | Status: AC
  Filled 2017-08-01: qty 100

## 2017-08-01 MED ORDER — METHOCARBAMOL 1000 MG/10ML IJ SOLN
500.0000 mg | Freq: Four times a day (QID) | INTRAVENOUS | Status: DC | PRN
  Administered 2017-08-01: 500 mg via INTRAVENOUS
  Filled 2017-08-01: qty 550

## 2017-08-01 SURGICAL SUPPLY — 54 items
BAG DECANTER FOR FLEXI CONT (MISCELLANEOUS) ×3 IMPLANT
BAG SPEC THK2 15X12 ZIP CLS (MISCELLANEOUS) ×1
BAG ZIPLOCK 12X15 (MISCELLANEOUS) ×3 IMPLANT
BANDAGE ACE 6X5 VEL STRL LF (GAUZE/BANDAGES/DRESSINGS) ×3 IMPLANT
BLADE SAG 18X100X1.27 (BLADE) ×3 IMPLANT
BLADE SAW SGTL 11.0X1.19X90.0M (BLADE) ×3 IMPLANT
BOWL SMART MIX CTS (DISPOSABLE) ×3 IMPLANT
CAPT KNEE TOTAL 3 ATTUNE ×2 IMPLANT
CEMENT HV SMART SET (Cement) ×6 IMPLANT
CLOSURE WOUND 1/2 X4 (GAUZE/BANDAGES/DRESSINGS) ×2
COVER SURGICAL LIGHT HANDLE (MISCELLANEOUS) ×3 IMPLANT
CUFF TOURN SGL QUICK 34 (TOURNIQUET CUFF) ×3
CUFF TRNQT CYL 34X4X40X1 (TOURNIQUET CUFF) ×1 IMPLANT
DECANTER SPIKE VIAL GLASS SM (MISCELLANEOUS) ×3 IMPLANT
DRAPE U-SHAPE 47X51 STRL (DRAPES) ×3 IMPLANT
DRSG ADAPTIC 3X8 NADH LF (GAUZE/BANDAGES/DRESSINGS) ×3 IMPLANT
DURAPREP 26ML APPLICATOR (WOUND CARE) ×3 IMPLANT
ELECT REM PT RETURN 15FT ADLT (MISCELLANEOUS) ×3 IMPLANT
EVACUATOR 1/8 PVC DRAIN (DRAIN) ×3 IMPLANT
GAUZE SPONGE 4X4 12PLY STRL (GAUZE/BANDAGES/DRESSINGS) ×3 IMPLANT
GLOVE BIO SURGEON STRL SZ7 (GLOVE) ×2 IMPLANT
GLOVE BIO SURGEON STRL SZ8 (GLOVE) ×3 IMPLANT
GLOVE BIOGEL PI IND STRL 6.5 (GLOVE) IMPLANT
GLOVE BIOGEL PI IND STRL 7.0 (GLOVE) IMPLANT
GLOVE BIOGEL PI IND STRL 7.5 (GLOVE) IMPLANT
GLOVE BIOGEL PI IND STRL 8 (GLOVE) ×1 IMPLANT
GLOVE BIOGEL PI INDICATOR 6.5 (GLOVE) ×2
GLOVE BIOGEL PI INDICATOR 7.0 (GLOVE) ×2
GLOVE BIOGEL PI INDICATOR 7.5 (GLOVE) ×6
GLOVE BIOGEL PI INDICATOR 8 (GLOVE) ×2
GLOVE SURG SS PI 6.5 STRL IVOR (GLOVE) ×2 IMPLANT
GLOVE SURG SS PI 7.5 STRL IVOR (GLOVE) ×2 IMPLANT
GOWN STRL REUS W/ TWL XL LVL3 (GOWN DISPOSABLE) IMPLANT
GOWN STRL REUS W/TWL LRG LVL3 (GOWN DISPOSABLE) ×9 IMPLANT
GOWN STRL REUS W/TWL XL LVL3 (GOWN DISPOSABLE) ×3
HANDPIECE INTERPULSE COAX TIP (DISPOSABLE) ×3
IMMOBILIZER KNEE 20 (SOFTGOODS) ×3
IMMOBILIZER KNEE 20 THIGH 36 (SOFTGOODS) ×1 IMPLANT
MANIFOLD NEPTUNE II (INSTRUMENTS) ×3 IMPLANT
PACK TOTAL KNEE CUSTOM (KITS) ×3 IMPLANT
PAD ABD 8X10 STRL (GAUZE/BANDAGES/DRESSINGS) ×2 IMPLANT
PADDING CAST COTTON 6X4 STRL (CAST SUPPLIES) ×9 IMPLANT
POSITIONER SURGICAL ARM (MISCELLANEOUS) ×3 IMPLANT
SET HNDPC FAN SPRY TIP SCT (DISPOSABLE) ×1 IMPLANT
STRIP CLOSURE SKIN 1/2X4 (GAUZE/BANDAGES/DRESSINGS) ×4 IMPLANT
SUT MNCRL AB 4-0 PS2 18 (SUTURE) ×3 IMPLANT
SUT STRATAFIX 0 PDS 27 VIOLET (SUTURE) ×3
SUT VIC AB 2-0 CT1 27 (SUTURE) ×9
SUT VIC AB 2-0 CT1 TAPERPNT 27 (SUTURE) ×3 IMPLANT
SUTURE STRATFX 0 PDS 27 VIOLET (SUTURE) ×1 IMPLANT
SYR 30ML LL (SYRINGE) ×6 IMPLANT
TRAY FOLEY W/METER SILVER 16FR (SET/KITS/TRAYS/PACK) ×3 IMPLANT
WRAP KNEE MAXI GEL POST OP (GAUZE/BANDAGES/DRESSINGS) ×3 IMPLANT
YANKAUER SUCT BULB TIP 10FT TU (MISCELLANEOUS) ×3 IMPLANT

## 2017-08-01 NOTE — Anesthesia Preprocedure Evaluation (Signed)
Anesthesia Evaluation  Patient identified by MRN, date of birth, ID band Patient awake    Reviewed: Allergy & Precautions, NPO status , Patient's Chart, lab work & pertinent test results  Airway Mallampati: II  TM Distance: >3 FB Neck ROM: Full    Dental no notable dental hx.    Pulmonary neg pulmonary ROS, Current Smoker,    Pulmonary exam normal breath sounds clear to auscultation       Cardiovascular negative cardio ROS Normal cardiovascular exam Rhythm:Regular Rate:Normal     Neuro/Psych Depression negative neurological ROS  negative psych ROS   GI/Hepatic negative GI ROS, Neg liver ROS,   Endo/Other  negative endocrine ROS  Renal/GU negative Renal ROS  negative genitourinary   Musculoskeletal negative musculoskeletal ROS (+) Arthritis , Osteoarthritis,    Abdominal   Peds negative pediatric ROS (+)  Hematology negative hematology ROS (+)   Anesthesia Other Findings   Reproductive/Obstetrics negative OB ROS                             Anesthesia Physical Anesthesia Plan  ASA: II  Anesthesia Plan: Spinal   Post-op Pain Management:  Regional for Post-op pain   Induction: Intravenous  PONV Risk Score and Plan: 0 and Ondansetron  Airway Management Planned: Simple Face Mask  Additional Equipment:   Intra-op Plan:   Post-operative Plan:   Informed Consent: I have reviewed the patients History and Physical, chart, labs and discussed the procedure including the risks, benefits and alternatives for the proposed anesthesia with the patient or authorized representative who has indicated his/her understanding and acceptance.   Dental advisory given  Plan Discussed with: CRNA  Anesthesia Plan Comments:         Anesthesia Quick Evaluation

## 2017-08-01 NOTE — Transfer of Care (Signed)
Immediate Anesthesia Transfer of Care Note  Patient: Miguel Benjamin  Procedure(s) Performed: LEFT TOTAL KNEE ARTHROPLASTY (Left Knee)  Patient Location: PACU  Anesthesia Type:MAC and Spinal  Level of Consciousness: awake, alert , oriented and patient cooperative  Airway & Oxygen Therapy: Patient Spontanous Breathing and Patient connected to face mask oxygen  Post-op Assessment: Report given to RN, Post -op Vital signs reviewed and stable and Patient moving all extremities  Post vital signs: Reviewed and stable  Last Vitals:  Vitals:   08/01/17 0550  BP: (!) 149/87  Pulse: 98  Resp: 18  Temp: 37.1 C  SpO2: 96%    Last Pain:  Vitals:   08/01/17 0550  TempSrc: Oral      Patients Stated Pain Goal: 4 (27/25/36 6440)  Complications: No apparent anesthesia complications

## 2017-08-01 NOTE — Anesthesia Procedure Notes (Signed)
Spinal  End time: 08/01/2017 7:21 AM Staffing Resident/CRNA: Victoriano Lain, CRNA Spinal Block Patient position: sitting Prep: ChloraPrep and site prepped and draped Patient monitoring: heart rate, continuous pulse ox and blood pressure Approach: midline Location: L3-4 Injection technique: single-shot Needle Needle type: Pencan  Needle length: 10 cm Assessment Sensory level: T4 Additional Notes Spinal kit date checked and verified. Placed in sitting position. One attempt by CRNA. + CSF, - heme. Pt tolerated well.

## 2017-08-01 NOTE — Anesthesia Postprocedure Evaluation (Signed)
Anesthesia Post Note  Patient: Miguel LuxDaniel Jon Benjamin  Procedure(s) Performed: LEFT TOTAL KNEE ARTHROPLASTY (Left Knee)     Patient location during evaluation: PACU Anesthesia Type: Spinal Level of consciousness: oriented and awake and alert Pain management: pain level controlled Vital Signs Assessment: post-procedure vital signs reviewed and stable Respiratory status: spontaneous breathing and respiratory function stable Cardiovascular status: blood pressure returned to baseline and stable Postop Assessment: no headache, no backache and no apparent nausea or vomiting Anesthetic complications: no    Last Vitals:  Vitals:   08/01/17 0915 08/01/17 0925  BP: 120/75   Pulse: 82 81  Resp: 18 (!) 22  Temp:    SpO2: 100% 100%    Last Pain:  Vitals:   08/01/17 0550  TempSrc: Oral                 Lowella CurbWarren Ray Kytzia Gienger

## 2017-08-01 NOTE — Op Note (Signed)
OPERATIVE REPORT-TOTAL KNEE ARTHROPLASTY   Pre-operative diagnosis- Osteoarthritis  Left knee(s)  Post-operative diagnosis- Osteoarthritis Left knee(s)  Procedure-  Left  Total Knee Arthroplasty (Depuy Attune)  Surgeon- Jonatha Gagen V. Roderica Cathell, MD  Assistant- Amber Constable, PA-C   Anesthesia-  Adductor canal block and spinal  EBL-50 mL   Drains Hemovac  Tourniquet time-  Total Tourniquet Time Documented: Thigh (Left) - 44 minutes Total: Thigh (Left) - 44 minutes     Complications- None  Condition-PACU - hemodynamically stable.   Brief Clinical Note   Miguel Benjamin is a 61 y.o. year old male with end stage OA of his left knee with progressively worsening pain and dysfunction. He has constant pain, with activity and at rest and significant functional deficits with difficulties even with ADLs. He has had extensive non-op management including analgesics, injections of cortisone and viscosupplements, and home exercise program, but remains in significant pain with significant dysfunction. Radiographs show bone on bone arthritis all 3 compartments with significant tibial subluxation. He presents now for left Total Knee Arthroplasty.     Procedure in detail---   The patient is brought into the operating room and positioned supine on the operating table. After successful administration of  Adductor canal block and spinal,   a tourniquet is placed high on the  Left thigh(s) and the lower extremity is prepped and draped in the usual sterile fashion. Time out is performed by the operating team and then the  Left lower extremity is wrapped in Esmarch, knee flexed and the tourniquet inflated to 300 mmHg.       A midline incision is made with a ten blade through the subcutaneous tissue to the level of the extensor mechanism. A fresh blade is used to make a medial parapatellar arthrotomy. Soft tissue over the proximal medial tibia is subperiosteally elevated to the joint line with a knife and  into the semimembranosus bursa with a Cobb elevator. Soft tissue over the proximal lateral tibia is elevated with attention being paid to avoiding the patellar tendon on the tibial tubercle. The patella is everted, knee flexed 90 degrees and the ACL and PCL are removed. Findings are bone on bone all 3 components with massive global osteophytes.        The drill is used to create a starting hole in the distal femur and the canal is thoroughly irrigated with sterile saline to remove the fatty contents. The 5 degree Left  valgus alignment guide is placed into the femoral canal and the distal femoral cutting block is pinned to remove 9 mm off the distal femur. Resection is made with an oscillating saw.      The tibia is subluxed forward and the menisci are removed. The extramedullary alignment guide is placed referencing proximally at the medial aspect of the tibial tubercle and distally along the second metatarsal axis and tibial crest. The block is pinned to remove 4mEldIzola Pri17mcEldIzola Pri54mcEldIzola Pri25mcEldIzola Pri45mcEldIzola Pri75mcEldIzola Pri30mcEldIzola Pri33mIzola Pri5mcEldIzola Pri97mcEldIzola Priced Manges more deficient medial  side. Resection is made with an oscillating saw. Size 7is the most appropriate size for the tibia and the proximal tibia is prepared with the modular drill and keel punch for that size.      The femoral sizing guide is placed and size 8 is most appropriate. Rotation is marked off the epicondylar axis and confirmed by creating a rectangular flexion gap at 90 degrees. The size 8 cutting block is pinned in this rotation and the anterior, posterior and chamfer cuts are made with the oscillating saw. The intercondylar block is  then placed and that cut is made.      Trial size 7 tibial component, trial size 8 posterior stabilized femur and a 12  mm posterior stabilized rotating platform insert trial is placed. Full extension is achieved with excellent varus/valgus and anterior/posterior balance throughout full range of motion. The patella is everted and thickness measured to be 27  mm. Free hand resection is taken to 15 mm,  a 38 template is placed, lug holes are drilled, trial patella is placed, and it tracks normally. Osteophytes are removed off the posterior femur with the trial in place. All trials are removed and the cut bone surfaces prepared with pulsatile lavage. Cement is mixed and once ready for implantation, the size 7 tibial implant, size  8 posterior stabilized femoral component, and the size 38 patella are cemented in place and the patella is held with the clamp. The trial insert is placed and the knee held in full extension. The Exparel (20 ml mixed with 60 ml saline) is injected into the extensor mechanism, posterior capsule, medial and lateral gutters and subcutaneous tissues.  All extruded cement is removed and once the cement is hard the permanent 12 mm posterior stabilized rotating platform insert is placed into the tibial tray.      The wound is copiously irrigated with saline solution and the extensor mechanism closed over a hemovac drain with #1 V-loc suture. The tourniquet is released for a total tourniquet time of 44  minutes. Flexion against gravity is 140 degrees and the patella tracks normally. Subcutaneous tissue is closed with 2.0 vicryl and subcuticular with running 4.0 Monocryl. The incision is cleaned and dried and steri-strips and a bulky sterile dressing are applied. The limb is placed into a knee immobilizer and the patient is awakened and transported to recovery in stable condition.      Please note that a surgical assistant was a medical necessity for this procedure in order to perform it in a safe and expeditious manner. Surgical assistant was necessary to retract the ligaments and vital neurovascular structures to prevent injury to them and also necessary for proper positioning of the limb to allow for anatomic placement of the prosthesis.   Gus RankinFrank V. Tannen Vandezande, MD    08/01/2017, 8:33 AM

## 2017-08-01 NOTE — Anesthesia Procedure Notes (Signed)
Anesthesia Regional Block: Adductor canal block   Pre-Anesthetic Checklist: ,, timeout performed, Correct Patient, Correct Site, Correct Laterality, Correct Procedure, Correct Position, site marked, Risks and benefits discussed,  Surgical consent,  Pre-op evaluation,  At surgeon's request and post-op pain management  Laterality: Left  Prep: chloraprep       Needles:  Injection technique: Single-shot  Needle Type: Stimiplex     Needle Length: 9cm  Needle Gauge: 21     Additional Needles:   Procedures:,,,, ultrasound used (permanent image in chart),,,,  Narrative:  Start time: 08/01/2017 6:56 AM End time: 08/01/2017 7:01 AM Injection made incrementally with aspirations every 5 mL.  Performed by: Personally  Anesthesiologist: Lowella CurbMiller, Margean Korell Ray, MD

## 2017-08-01 NOTE — Interval H&P Note (Signed)
History and Physical Interval Note:  08/01/2017 6:49 AM  Miguel Benjamin  has presented today for surgery, with the diagnosis of Left knee osteoarthritis   The various methods of treatment have been discussed with the patient and family. After consideration of risks, benefits and other options for treatment, the patient has consented to  Procedure(s): LEFT TOTAL KNEE ARTHROPLASTY (Left) as a surgical intervention .  The patient's history has been reviewed, patient examined, no change in status, stable for surgery.  I have reviewed the patient's chart and labs.  Questions were answered to the patient's satisfaction.     Homero FellersFrank Taos Tapp

## 2017-08-01 NOTE — Evaluation (Signed)
Physical Therapy Evaluation Patient Details Name: Miguel LuxDaniel Jon Benjamin MRN: [id number] DOB: 29-Oct-1955 Today's Date: 08/01/2017   History of Present Illness  L TKA  Clinical Impression  Pt is s/p TKA resulting in the deficits listed below (see PT Problem List). Pt ambulated 4560' with RW and performed TKA exercises with min A. Pt able to do SLR independently. Excellent progress expected.  Pt will benefit from skilled PT to increase their independence and safety with mobility to allow discharge to the venue listed below.      Follow Up Recommendations Outpatient PT;DC plan and follow up therapy as arranged by surgeon(outpt appt on Thurs)    Equipment Recommendations  3in1 (PT);Rolling walker with 5" wheels    Recommendations for Other Services       Precautions / Restrictions Precautions Precautions: Knee Precaution Booklet Issued: Yes (comment) Precaution Comments: reviewed no pillow under knee Required Braces or Orthoses: Knee Immobilizer - Left Knee Immobilizer - Left: Discontinue once straight leg raise with < 10 degree lag Restrictions Weight Bearing Restrictions: No Other Position/Activity Restrictions: WBAT      Mobility  Bed Mobility Overal bed mobility: Modified Independent             General bed mobility comments: self assisted LLE with RLE, HOB up 30*  Transfers Overall transfer level: Needs assistance Equipment used: Rolling walker (2 wheeled) Transfers: Sit to/from Stand Sit to Stand: Min guard         General transfer comment: VCs hand placement  Ambulation/Gait Ambulation/Gait assistance: Min guard Ambulation Distance (Feet): 60 Feet Assistive device: Rolling walker (2 wheeled) Gait Pattern/deviations: Step-to pattern;Step-through pattern;Decreased stride length;Decreased weight shift to left   Gait velocity interpretation: at or above normal speed for age/gender General Gait Details: VCs sequencing, steady with RW, no LOB  Stairs            Wheelchair Mobility    Modified Rankin (Stroke Patients Only)       Balance Overall balance assessment: Modified Independent                                           Pertinent Vitals/Pain Pain Assessment: 0-10 Pain Score: 4  Pain Location: L knee Pain Descriptors / Indicators: Sore Pain Intervention(s): Monitored during session;Limited activity within patient's tolerance;Premedicated before session;Ice applied    Home Living Family/patient expects to be discharged to:: Private residence Living Arrangements: Spouse/significant other Available Help at Discharge: Family;Available 24 hours/day   Home Access: Stairs to enter Entrance Stairs-Rails: None Entrance Stairs-Number of Steps: 2 Home Layout: One level Home Equipment: Cane - single point;Shower seat      Prior Function Level of Independence: Independent with assistive device(s)         Comments: walked with cane     Hand Dominance        Extremity/Trunk Assessment   Upper Extremity Assessment Upper Extremity Assessment: Overall WFL for tasks assessed    Lower Extremity Assessment Lower Extremity Assessment: LLE deficits/detail(h/o R knee ACL repair) LLE Deficits / Details: SLR 3/5, knee ext -3/5, AAROM 5-75*; pt reports L knee hyperextended prior to surgery    Cervical / Trunk Assessment Cervical / Trunk Assessment: Normal  Communication   Communication: No difficulties  Cognition Arousal/Alertness: Awake/alert Behavior During Therapy: WFL for tasks assessed/performed Overall Cognitive Status: Within Functional Limits for tasks assessed  General Comments      Exercises Total Joint Exercises Ankle Circles/Pumps: AROM;Both;15 reps Quad Sets: AROM;Both;5 reps;Supine Heel Slides: AAROM;Left;10 reps;Supine Straight Leg Raises: AROM;Left;5 reps;Supine Long Arc Quad: AROM;Left;5 reps;Seated Goniometric ROM: 5-75* AAROM  L knee   Assessment/Plan    PT Assessment Patient needs continued PT services  PT Problem List Decreased strength;Decreased range of motion;Decreased activity tolerance;Decreased mobility;Pain;Decreased knowledge of use of DME       PT Treatment Interventions DME instruction;Gait training;Stair training;Functional mobility training;Therapeutic activities;Therapeutic exercise;Patient/family education    PT Goals (Current goals can be found in the Care Plan section)  Acute Rehab PT Goals Patient Stated Goal: tennis and golf PT Goal Formulation: With patient/family Time For Goal Achievement: 08/08/17 Potential to Achieve Goals: Good    Frequency 7X/week   Barriers to discharge        Co-evaluation               AM-PAC PT "6 Clicks" Daily Activity  Outcome Measure Difficulty turning over in bed (including adjusting bedclothes, sheets and blankets)?: A Little Difficulty moving from lying on back to sitting on the side of the bed? : A Little Difficulty sitting down on and standing up from a chair with arms (e.g., wheelchair, bedside commode, etc,.)?: A Little Help needed moving to and from a bed to chair (including a wheelchair)?: A Little Help needed walking in hospital room?: A Little Help needed climbing 3-5 steps with a railing? : A Lot 6 Click Score: 17    End of Session Equipment Utilized During Treatment: Gait belt Activity Tolerance: Patient tolerated treatment well Patient left: in chair;with call bell/phone within reach;with family/visitor present Nurse Communication: Mobility status PT Visit Diagnosis: Muscle weakness (generalized) (M62.81);Pain;Difficulty in walking, not elsewhere classified (R26.2) Pain - Right/Left: Left Pain - part of body: Knee    Time: [id number] PT Time Calculation (min) (ACUTE ONLY): 41 min   Charges:   PT Evaluation $PT Eval Low Complexity: 1 Low PT Treatments $Gait Training: 8-22 mins $Therapeutic Exercise: 8-22 mins   PT G  Codes:          Ralene BatheUhlenberg, Miguel Benjamin 08/01/2017, 3:15 PM [phone]

## 2017-08-02 LAB — BASIC METABOLIC PANEL
Anion gap: 5 (ref 5–15)
BUN: 14 mg/dL (ref 6–20)
CALCIUM: 8.3 mg/dL — AB (ref 8.9–10.3)
CHLORIDE: 103 mmol/L (ref 101–111)
CO2: 27 mmol/L (ref 22–32)
Creatinine, Ser: 0.87 mg/dL (ref 0.61–1.24)
GFR calc non Af Amer: >60 mL/min (ref >60)
Glucose, Bld: 128 mg/dL — ABNORMAL HIGH (ref 65–99)
Potassium: 3.7 mmol/L (ref 3.5–5.1)
Sodium: 135 mmol/L (ref 135–145)

## 2017-08-02 LAB — CBC
HEMATOCRIT: 33.8 % — AB (ref 39.0–52.0)
Hemoglobin: 11.2 g/dL — ABNORMAL LOW (ref 13.0–17.0)
MCH: 30.4 pg (ref 26.0–34.0)
MCHC: 33.1 g/dL (ref 30.0–36.0)
MCV: 91.6 fL (ref 78.0–100.0)
Platelets: 170 10*3/uL (ref 150–400)
RBC: 3.69 MIL/uL — ABNORMAL LOW (ref 4.22–5.81)
RDW: 13.3 % (ref 11.5–15.5)
WBC: 12.4 10*3/uL — ABNORMAL HIGH (ref 4.0–10.5)

## 2017-08-02 MED ORDER — RIVAROXABAN 10 MG PO TABS: 10.0000 mg | ORAL_TABLET | Freq: Every day | ORAL | 0 refills | Status: DC

## 2017-08-02 MED ORDER — TRAMADOL HCL 50 MG PO TABS: 50.0000 mg | ORAL_TABLET | Freq: Four times a day (QID) | ORAL | Status: DC | PRN

## 2017-08-02 MED ORDER — METHOCARBAMOL 500 MG PO TABS: 500.0000 mg | ORAL_TABLET | Freq: Four times a day (QID) | ORAL | 0 refills | Status: DC | PRN

## 2017-08-02 MED ORDER — TRAMADOL HCL 50 MG PO TABS: 50.0000 mg | ORAL_TABLET | Freq: Four times a day (QID) | ORAL | 0 refills | Status: DC | PRN

## 2017-08-02 MED ORDER — OXYCODONE HCL 5 MG PO TABS: 5.0000 mg | ORAL_TABLET | ORAL | 0 refills | Status: DC | PRN

## 2017-08-02 NOTE — Evaluation (Signed)
Occupational Therapy Evaluation Patient Details Name: Miguel Benjamin MRN: [id number] DOB: 05-21-1956 Today's Date: 08/02/2017    History of Present Illness L TKA   Clinical Impression   Pt admitted as above currently demonstrating deficits in his ability to perform LB ADL's as he is limited by pain. [facility] for LB ADL's and reports that he will not need this as his wife can assist PRN at d/c. Pt is overall supervision-min guard functional mobility/transfers. He has a level entry shower and shower chair at home. He should benefit from 3:1 at home and intermittent supervision/assist. He denies any further acute OT needs at this time. Will sign off.    Follow Up Recommendations  No OT follow up;Supervision - Intermittent;DC plan and follow up therapy as arranged by surgeon    Equipment Recommendations  3 in 1 bedside commode    Recommendations for Other Services       Precautions / Restrictions Precautions Precautions: Knee Precaution Comments: reviewed no pillow under knee Restrictions Weight Bearing Restrictions: Yes LLE Weight Bearing: Weight bearing as tolerated Other Position/Activity Restrictions: WBAT      Mobility Bed Mobility Overal bed mobility: Modified Independent             General bed mobility comments: self assisted LLE with RLE, HOB up 30*  Transfers Overall transfer level: Needs assistance Equipment used: Rolling walker (2 wheeled) Transfers: Sit to/from UGI CorporationStand;Stand Pivot Transfers Sit to Stand: Supervision Stand pivot transfers: Supervision       General transfer comment: VC's for technique    Balance Overall balance assessment: Modified Independent                                         ADL either performed or assessed with clinical judgement   ADL Overall ADL's : Needs assistance/impaired Eating/Feeding: Independent   Grooming: Set up;Sitting   Upper Body Bathing: Set up;Sitting   Lower Body  Bathing: Sit to/from stand;Min guard   Upper Body Dressing : Independent;Sitting   Lower Body Dressing: Min guard;Sit to/from stand Lower Body Dressing Details (indicate cue type and reason): Min A don sock LLE. Pt [facility] in [facility] use and states "I won't need one of those, and my wife will bbe there" Toilet Transfer: Multimedia programmerW;BSC;Ambulation;Stand-pivot;Supervision/safety Toilet Transfer Details (indicate cue type and reason): 3:1 over toilet in bathroom.  Toileting- Clothing Manipulation and Hygiene: Supervision/safety;Sit to/from stand   Tub/ Shower Transfer: Museum/gallery curatorWalk-in shower;Rolling walker;Ambulation;Shower seat;Min Psychologist, prison and probation servicesguard Tub/Shower Transfer Details (indicate cue type and reason): Pt has level entry walk in shower at home and shower seat/chair. Reviewed use of chair initially for safety PRN Functional mobility during ADLs: Min guard;Rolling walker General ADL Comments: Pt was educated in role of OT followed by assessment and ADL retraining session to include education on [facility] a/e - handout issued and reviewed with pt. Pt denies need for further acute OT at this time as he will have necessary PRN Assistance from his wife at d/c. Recommend 3:1 for home use. Pt pain level increased from 6 to 7 LLE after functional mobility, RN was notified that pt was requesting pain medication. Ice issued, pt staets that he will re-apply. Will sign off acute OT at this time.     Vision Baseline Vision/History: Wears glasses Wears Glasses: Reading only Patient Visual Report: No change from baseline       Perception  Praxis      Pertinent Vitals/Pain Pain Assessment: 0-10 Pain Score: 6  Pain Location: L knee Pain Descriptors / Indicators: Sore;Guarding Pain Intervention(s): Monitored during session;Limited activity within patient's tolerance;Repositioned     Hand Dominance Right   Extremity/Trunk Assessment Upper Extremity Assessment Upper Extremity Assessment: Overall WFL for tasks assessed    Lower Extremity Assessment Lower Extremity Assessment: Defer to PT evaluation       Communication Communication Communication: No difficulties   Cognition Arousal/Alertness: Awake/alert Behavior During Therapy: WFL for tasks assessed/performed Overall Cognitive Status: Within Functional Limits for tasks assessed                                     General Comments       Exercises     Shoulder Instructions      Home Living Family/patient expects to be discharged to:: Private residence Living Arrangements: Spouse/significant other Available Help at Discharge: Family;Available 24 hours/day   Home Access: Stairs to enter Entrance Stairs-Number of Steps: 2 Entrance Stairs-Rails: None Home Layout: One level     Bathroom Shower/Tub: Producer, television/film/videoWalk-in shower   Bathroom Toilet: Standard     Home Equipment: Cane - single point;Shower seat          Prior Functioning/Environment Level of Independence: Independent with assistive device(s)        Comments: walked with cane        OT Problem List: Pain      OT Treatment/Interventions:      OT Goals(Current goals can be found in the care plan section) Acute Rehab OT Goals Patient Stated Goal: Decreased pain L knee/thigh, d/c home with wife assist OT Goal Formulation: All assessment and education complete, DC therapy  OT Frequency:     Barriers to D/C:            Co-evaluation              AM-PAC PT "6 Clicks" Daily Activity     Outcome Measure Help from another person eating meals?: None Help from another person taking care of personal grooming?: None Help from another person toileting, which includes using toliet, bedpan, or urinal?: A Little Help from another person bathing (including washing, rinsing, drying)?: A Little Help from another person to put on and taking off regular upper body clothing?: None Help from another person to put on and taking off regular lower body clothing?: A  Little 6 Click Score: 21   End of Session Equipment Utilized During Treatment: Rolling walker Nurse Communication: Patient requests pain meds  Activity Tolerance: Patient tolerated treatment well;Patient limited by pain Patient left: in bed;with call bell/phone within reach;with bed alarm set  OT Visit Diagnosis: Pain Pain - Right/Left: Left Pain - part of body: Knee                Time: [id number] OT Time Calculation (min): 26 min Charges:  OT General Charges $OT Visit: 1 Visit OT Evaluation $OT Eval Low Complexity: 1 Low OT Treatments $Self Care/Home Management : 8-22 mins G-Codes:      Alm BustardBarnhill, Zimir Kittleson Beth Dixon, OTR/L 08/02/2017, 8:36 AM

## 2017-08-02 NOTE — Progress Notes (Signed)
   Subjective: 1 Day Post-Op Procedure(s) (LRB): LEFT TOTAL KNEE ARTHROPLASTY (Left) Patient reports pain as mild pain last night, more sore today. Patient seen in rounds for Dr. Lequita HaltAluisio. Patient is well, but has had some minor complaints of pain in the ankle and thigh, requiring pain medications We will resume therapy today. Walked in hallway yesterday. Plan is to go Home after hospital stay.  Objective: Vital signs in last 24 hours: Temp:  [97.3 F (36.3 C)-98.8 F (37.1 C)] 98.5 F (36.9 C) (12/11 0509) Pulse Rate:  [77-96] 96 (12/11 0509) Resp:  [12-22] 19 (12/11 0509) BP: (106-140)/(71-89) 122/78 (12/11 0509) SpO2:  [97 %-100 %] 97 % (12/11 0509) Weight:  [84.4 kg (186 lb)] 84.4 kg (186 lb) (12/10 0957)  Intake/Output from previous day:  Intake/Output Summary (Last 24 hours) at 08/02/2017 0751 Last data filed at 08/02/2017 0626 Gross per 24 hour  Intake 5620 ml  Output 3485 ml  Net 2135 ml    Intake/Output this shift: No intake/output data recorded.  Labs: Recent Labs    08/02/17 0551  HGB 11.2*   Recent Labs    08/02/17 0551  WBC 12.4*  RBC 3.69*  HCT 33.8*  PLT 170   Recent Labs    08/02/17 0551  NA 135  K 3.7  CL 103  CO2 27  BUN 14  CREATININE 0.87  GLUCOSE 128*  CALCIUM 8.3*   No results for input(s): LABPT, INR in the last 72 hours.  EXAM General - Patient is Alert, Appropriate and Oriented Extremity - Neurovascular intact Sensation intact distally Intact pulses distally Dorsiflexion/Plantar flexion intact Dressing - dressing C/D/I Motor Function - intact, moving foot and toes well on exam.  Hemovac pulled without difficulty.  Past Medical History:  Diagnosis Date  . Allergy   . Arthritis   . COPD (chronic obstructive pulmonary disease) (HCC)     Assessment/Plan: 1 Day Post-Op Procedure(s) (LRB): LEFT TOTAL KNEE ARTHROPLASTY (Left) Principal Problem:   OA (osteoarthritis) of knee  Estimated body mass index is 25.23 kg/m  as calculated from the following:   Height as of this encounter: 6' (1.829 m).   Weight as of this encounter: 84.4 kg (186 lb). Advance diet Up with therapy Plan for discharge tomorrow Discharge home - straight to outpatient therapy  DVT Prophylaxis - Xarelto Weight-Bearing as tolerated to left leg D/C O2 and Pulse OX and try on Room Air  Avel Peacerew Perkins, PA-C Orthopaedic Surgery 08/02/2017, 7:51 AM

## 2017-08-02 NOTE — Progress Notes (Signed)
Physical Therapy Treatment Patient Details Name: Miguel Benjamin MRN: [id number] DOB: 1956-07-04 Today's Date: 08/02/2017    History of Present Illness L TKA    PT Comments    Pt reports increased pain in L thigh and ankle today, now is not able to do SLR independently. Pt ambulated 160' with RW and L KI. Performed TKA exercises with min A. Pt progressing well.    Follow Up Recommendations  Outpatient PT;DC plan and follow up therapy as arranged by surgeon(outpt appt on Thurs)     Equipment Recommendations  3in1 (PT);Rolling walker with 5" wheels    Recommendations for Other Services       Precautions / Restrictions Precautions Precautions: Knee Precaution Booklet Issued: Yes (comment) Precaution Comments: reviewed no pillow under knee Required Braces or Orthoses: Knee Immobilizer - Left Knee Immobilizer - Left: Discontinue once straight leg raise with < 10 degree lag Restrictions Weight Bearing Restrictions: No LLE Weight Bearing: Weight bearing as tolerated Other Position/Activity Restrictions: WBAT    Mobility  Bed Mobility Overal bed mobility: Modified Independent             General bed mobility comments: self assisted LLE with RLE, HOB up 30*  Transfers Overall transfer level: Needs assistance Equipment used: Rolling walker (2 wheeled) Transfers: Sit to/from Stand Sit to Stand: Supervision Stand pivot transfers: Supervision       General transfer comment: VC's for technique  Ambulation/Gait Ambulation/Gait assistance: Min guard Ambulation Distance (Feet): 160 Feet Assistive device: Rolling walker (2 wheeled) Gait Pattern/deviations: Step-to pattern;Step-through pattern;Decreased stride length;Decreased weight shift to left   Gait velocity interpretation: Below normal speed for age/gender General Gait Details: VCs sequencing, steady with RW, no LOB   Stairs            Wheelchair Mobility    Modified Rankin (Stroke Patients Only)        Balance Overall balance assessment: Modified Independent                                          Cognition Arousal/Alertness: Awake/alert Behavior During Therapy: WFL for tasks assessed/performed Overall Cognitive Status: Within Functional Limits for tasks assessed                                        Exercises Total Joint Exercises Ankle Circles/Pumps: AROM;Both;15 reps Quad Sets: AROM;Both;5 reps;Supine Short Arc Quad: AAROM;Left;10 reps;Supine Heel Slides: AAROM;Left;10 reps;Supine Straight Leg Raises: Left;Supine;AAROM;10 reps Goniometric ROM: 5-55* AAROM L knee    General Comments        Pertinent Vitals/Pain Pain Assessment: 0-10 Pain Score: 7  Pain Location: L thigh Pain Descriptors / Indicators: Sore;Guarding Pain Intervention(s): Limited activity within patient's tolerance;Premedicated before session;Monitored during session;Ice applied    Home Living Family/patient expects to be discharged to:: Private residence Living Arrangements: Spouse/significant other Available Help at Discharge: Family;Available 24 hours/day   Home Access: Stairs to enter Entrance Stairs-Rails: None Home Layout: One level Home Equipment: Cane - single point;Shower seat      Prior Function Level of Independence: Independent with assistive device(s)      Comments: walked with cane   PT Goals (current goals can now be found in the care plan section) Acute Rehab PT Goals Patient Stated Goal: tennis and golf PT Goal Formulation: With patient/family  Time For Goal Achievement: 08/08/17 Potential to Achieve Goals: Good Progress towards PT goals: Progressing toward goals    Frequency    7X/week      PT Plan Current plan remains appropriate    Co-evaluation              AM-PAC PT "6 Clicks" Daily Activity  Outcome Measure  Difficulty turning over in bed (including adjusting bedclothes, sheets and blankets)?: A  Little Difficulty moving from lying on back to sitting on the side of the bed? : A Little Difficulty sitting down on and standing up from a chair with arms (e.g., wheelchair, bedside commode, etc,.)?: A Little Help needed moving to and from a bed to chair (including a wheelchair)?: A Little Help needed walking in hospital room?: A Little Help needed climbing 3-5 steps with a railing? : A Lot 6 Click Score: 17    End of Session Equipment Utilized During Treatment: Gait belt;Left knee immobilizer Activity Tolerance: Patient tolerated treatment well Patient left: in chair;with call bell/phone within reach;with family/visitor present Nurse Communication: Mobility status PT Visit Diagnosis: Muscle weakness (generalized) (M62.81);Pain;Difficulty in walking, not elsewhere classified (R26.2) Pain - Right/Left: Left Pain - part of body: Knee     Time: [id number] PT Time Calculation (min) (ACUTE ONLY): 23 min  Charges:  $Gait Training: 8-22 mins $Therapeutic Exercise: 8-22 mins                    G Codes:          Ralene BatheUhlenberg, Clarnce Homan Kistler 08/02/2017, 10:08 AM [phone]

## 2017-08-02 NOTE — Progress Notes (Signed)
Spoke with patient at bedside. Confirmed plan for OP PT, already arranged. Needs a RW and 3n1, contacted [facility] to deliver to room. [phone] 

## 2017-08-02 NOTE — Discharge Instructions (Signed)
°  ° °Dr. Frank Aluisio °Total Joint Specialist °[facility] °[address] °, [place] [address] °[phone] ° °TOTAL KNEE REPLACEMENT POSTOPERATIVE DIRECTIONS ° °Knee Rehabilitation, Guidelines Following Surgery  °Results after knee surgery are often greatly improved when you follow the exercise, range of motion and muscle strengthening exercises prescribed by your doctor. Safety measures are also important to protect the knee from further injury. Any time any of these exercises cause you to have increased pain or swelling in your knee joint, decrease the amount until you are comfortable again and slowly increase them. If you have problems or questions, call your caregiver or physical therapist for advice.  ° °HOME CARE INSTRUCTIONS  °Remove items at home which could result in a fall. This includes throw rugs or furniture in walking pathways.  °· ICE to the affected knee every three hours for 30 minutes at a time and then as needed for pain and swelling.  Continue to use ice on the knee for pain and swelling from surgery. You may notice swelling that will progress down to the foot and ankle.  This is normal after surgery.  Elevate the leg when you are not up walking on it.   °· Continue to use the breathing machine which will help keep your temperature down.  It is common for your temperature to cycle up and down following surgery, especially at night when you are not up moving around and exerting yourself.  The breathing machine keeps your lungs expanded and your temperature down. °· Do not place pillow under knee, focus on keeping the knee straight while resting ° °DIET °You may resume your previous home diet once your are discharged from the hospital. ° °DRESSING / WOUND CARE / SHOWERING °You may shower 3 days after surgery, but keep the wounds dry during showering.  You may use an occlusive plastic wrap (Press'n Seal for example), NO SOAKING/SUBMERGING IN THE BATHTUB.  If the  bandage gets wet, change with a clean dry gauze.  If the incision gets wet, pat the wound dry with a clean towel. °You may start showering once you are discharged home but do not submerge the incision under water. Just pat the incision dry and apply a dry gauze dressing on daily. °Change the surgical dressing daily and reapply a dry dressing each time. ° °ACTIVITY °Walk with your walker as instructed. °Use walker as long as suggested by your caregivers. °Avoid periods of inactivity such as sitting longer than an hour when not asleep. This helps prevent blood clots.  °You may resume a sexual relationship in one month or when given the OK by your doctor.  °You may return to work once you are cleared by your doctor.  °Do not drive a car for 6 weeks or until released by you surgeon.  °Do not drive while taking narcotics. ° °WEIGHT BEARING °Weight bearing as tolerated with assist device (walker, cane, etc) as directed, use it as long as suggested by your surgeon or therapist, typically at least 4-6 weeks. ° °POSTOPERATIVE CONSTIPATION PROTOCOL °Constipation - defined medically as fewer than three stools per week and severe constipation as less than one stool per week. ° °One of the most common issues patients have following surgery is constipation.  Even if you have a regular bowel pattern at home, your normal regimen is likely to be disrupted due to multiple reasons following surgery.  Combination of anesthesia, postoperative narcotics, change in appetite and fluid intake all can affect your   bowels.  In order to avoid complications following surgery, here are some recommendations in order to help you during your recovery period. ° °Colace (docusate) - Pick up an over-the-counter form of Colace or another stool softener and take twice a day as long as you are requiring postoperative pain medications.  Take with a full glass of water daily.  If you experience loose stools or diarrhea, hold the colace until you stool forms  back up.  If your symptoms do not get better within 1 week or if they get worse, check with your doctor. ° °Dulcolax (bisacodyl) - Pick up over-the-counter and take as directed by the product packaging as needed to assist with the movement of your bowels.  Take with a full glass of water.  Use this product as needed if not relieved by Colace only.  ° °MiraLax (polyethylene glycol) - Pick up over-the-counter to have on hand.  MiraLax is a solution that will increase the amount of water in your bowels to assist with bowel movements.  Take as directed and can mix with a glass of water, juice, soda, coffee, or tea.  Take if you go more than two days without a movement. °Do not use MiraLax more than once per day. Call your doctor if you are still constipated or irregular after using this medication for 7 days in a row. ° °If you continue to have problems with postoperative constipation, please contact the office for further assistance and recommendations.  If you experience "the worst abdominal pain ever" or develop nausea or vomiting, please contact the office immediatly for further recommendations for treatment. ° °ITCHING ° If you experience itching with your medications, try taking only a single pain pill, or even half a pain pill at a time.  You can also use Benadryl over the counter for itching or also to help with sleep.  ° °TED HOSE STOCKINGS °Wear the elastic stockings on both legs for three weeks following surgery during the day but you may remove then at night for sleeping. ° °MEDICATIONS °See your medication summary on the “After Visit Summary” that the nursing staff will review with you prior to discharge.  You may have some home medications which will be placed on hold until you complete the course of blood thinner medication.  It is important for you to complete the blood thinner medication as prescribed by your surgeon.  Continue your approved medications as instructed at time of  discharge. ° °PRECAUTIONS °If you experience chest pain or shortness of breath - call 911 immediately for transfer to the hospital emergency department.  °If you develop a fever greater that 101 F, purulent drainage from wound, increased redness or drainage from wound, foul odor from the wound/dressing, or calf pain - CONTACT YOUR SURGEON.   °                                                °FOLLOW-UP APPOINTMENTS °Make sure you keep all of your appointments after your operation with your surgeon and caregivers. You should call the office at the above phone number and make an appointment for approximately two weeks after the date of your surgery or on the date instructed by your surgeon outlined in the "After Visit Summary". ° ° °RANGE OF MOTION AND STRENGTHENING EXERCISES  °Rehabilitation of the knee is important following a knee   injury or an operation. After just a few days of immobilization, the muscles of the thigh which control the knee become weakened and shrink (atrophy). Knee exercises are designed to build up the tone and strength of the thigh muscles and to improve knee motion. Often times heat used for twenty to thirty minutes before working out will loosen up your tissues and help with improving the range of motion but do not use heat for the first two weeks following surgery. These exercises can be done on a training (exercise) mat, on the floor, on a table or on a bed. Use what ever works the best and is most comfortable for you Knee exercises include:  °Leg Lifts - While your knee is still immobilized in a splint or cast, you can do straight leg raises. Lift the leg to 60 degrees, hold for 3 sec, and slowly lower the leg. Repeat 10-20 times 2-3 times daily. Perform this exercise against resistance later as your knee gets better.  °Quad and Hamstring Sets - Tighten up the muscle on the front of the thigh (Quad) and hold for 5-10 sec. Repeat this 10-20 times hourly. Hamstring sets are done by pushing the  foot backward against an object and holding for 5-10 sec. Repeat as with quad sets.  °· Leg Slides: Lying on your back, slowly slide your foot toward your buttocks, bending your knee up off the floor (only go as far as is comfortable). Then slowly slide your foot back down until your leg is flat on the floor again. °· Angel Wings: Lying on your back spread your legs to the side as far apart as you can without causing discomfort.  °A rehabilitation program following serious knee injuries can speed recovery and prevent re-injury in the future due to weakened muscles. Contact your doctor or a physical therapist for more information on knee rehabilitation.  ° °IF YOU ARE TRANSFERRED TO A SKILLED REHAB FACILITY °If the patient is transferred to a skilled rehab facility following release from the hospital, a list of the current medications will be sent to the facility for the patient to continue.  When discharged from the skilled rehab facility, please have the facility set up the patient's Home Health Physical Therapy prior to being released. Also, the skilled facility will be responsible for providing the patient with their medications at time of release from the facility to include their pain medication, the muscle relaxants, and their blood thinner medication. If the patient is still at the rehab facility at time of the two week follow up appointment, the skilled rehab facility will also need to assist the patient in arranging follow up appointment in our office and any transportation needs. ° °MAKE SURE YOU:  °Understand these instructions.  °Get help right away if you are not doing well or get worse.  ° ° °Pick up stool softner and laxative for home use following surgery while on pain medications. °Do not submerge incision under water. °Please use good hand washing techniques while changing dressing each day. °May shower starting three days after surgery. °Please use a clean towel to pat the incision dry following  showers. °Continue to use ice for pain and swelling after surgery. °Do not use any lotions or creams on the incision until instructed by your surgeon. ° °Take Xarelto for two and a half more weeks following discharge from the hospital, then discontinue Xarelto. °Once the patient has completed the blood thinner regimen, then take a Baby 81 mg Aspirin daily   for three more weeks. ° ° °Information on my medicine - XARELTO® (Rivaroxaban) ° ° °Why was Xarelto® prescribed for you? °Xarelto® was prescribed for you to reduce the risk of blood clots forming after orthopedic surgery. The medical term for these abnormal blood clots is venous thromboembolism (VTE). ° °What do you need to know about xarelto® ? °Take your Xarelto® ONCE DAILY at the same time every day. °You may take it either with or without food. ° °If you have difficulty swallowing the tablet whole, you may crush it and mix in applesauce just prior to taking your dose. ° °Take Xarelto® exactly as prescribed by your doctor and DO NOT stop taking Xarelto® without talking to the doctor who prescribed the medication.  Stopping without other VTE prevention medication to take the place of Xarelto® may increase your risk of developing a clot. ° °After discharge, you should have regular check-up appointments with your healthcare provider that is prescribing your Xarelto®.   ° °What do you do if you miss a dose? °If you miss a dose, take it as soon as you remember on the same day then continue your regularly scheduled once daily regimen the next day. Do not take two doses of Xarelto® on the same day.  ° °Important Safety Information °A possible side effect of Xarelto® is bleeding. You should call your healthcare provider right away if you experience any of the following: °? Bleeding from an injury or your nose that does not stop. °? Unusual colored urine (red or dark brown) or unusual colored stools (red or black). °? Unusual bruising for unknown reasons. °? A serious  fall or if you hit your head (even if there is no bleeding). ° °Some medicines may interact with Xarelto® and might increase your risk of bleeding while on Xarelto®. To help avoid this, consult your healthcare provider or pharmacist prior to using any new prescription or non-prescription medications, including herbals, vitamins, non-steroidal anti-inflammatory drugs (NSAIDs) and supplements. ° °This website has more information on Xarelto®: www.xarelto.com. ° ° °

## 2017-08-02 NOTE — Discharge Summary (Signed)
Physician Discharge Summary   Patient ID: Miguel Benjamin MRN: [id number] DOB/AGE: 61-Mar-1957 61 y.o.  Admit date: 08/01/2017 Discharge date: 08-03-17  Primary Diagnosis:  Osteoarthritis Left knee(s)   Admission Diagnoses:  Past Medical History:  Diagnosis Date  . Allergy   . Arthritis   . COPD (chronic obstructive pulmonary disease) ([place])    Discharge Diagnoses:   Principal Problem:   OA (osteoarthritis) of knee  Estimated body mass index is 25.23 kg/m as calculated from the following:   Height as of this encounter: 6' (1.829 m).   Weight as of this encounter: 84.4 kg (186 lb).  Procedure:  Procedure(s) (LRB): LEFT TOTAL KNEE ARTHROPLASTY (Left)   Consults: None  HPI: Miguel Benjamin is a 61 y.o. year old male with end stage OA of his left knee with progressively worsening pain and dysfunction. He has constant pain, with activity and at rest and significant functional deficits with difficulties even with ADLs. He has had extensive non-op management including analgesics, injections of cortisone and viscosupplements, and home exercise program, but remains in significant pain with significant dysfunction. Radiographs show bone on bone arthritis all 3 compartments with significant tibial subluxation. He presents now for left Total Knee Arthroplasty.    Laboratory Data: Admission on 08/01/2017  Component Date Value Ref Range Status  . WBC 08/02/2017 12.4* 4.0 - 10.5 K/uL Final  . RBC 08/02/2017 3.69* 4.22 - 5.81 MIL/uL Final  . Hemoglobin 08/02/2017 11.2* 13.0 - 17.0 g/dL Final  . HCT 08/02/2017 33.8* 39.0 - 52.0 % Final  . MCV 08/02/2017 91.6  78.0 - 100.0 fL Final  . MCH 08/02/2017 30.4  26.0 - 34.0 pg Final  . MCHC 08/02/2017 33.1  30.0 - 36.0 g/dL Final  . RDW 08/02/2017 13.3  11.5 - 15.5 % Final  . Platelets 08/02/2017 170  150 - 400 K/uL Final  . Sodium 08/02/2017 135  135 - 145 mmol/L Final  . Potassium 08/02/2017 3.7  3.5 - 5.1 mmol/L Final  . Chloride  08/02/2017 103  101 - 111 mmol/L Final  . CO2 08/02/2017 27  22 - 32 mmol/L Final  . Glucose, Bld 08/02/2017 128* 65 - 99 mg/dL Final  . BUN 08/02/2017 14  6 - 20 mg/dL Final  . Creatinine, Ser 08/02/2017 0.87  0.61 - 1.24 mg/dL Final  . Calcium 08/02/2017 8.3* 8.9 - 10.3 mg/dL Final  . GFR calc non Af Amer 08/02/2017 >60  >60 mL/min Final  . GFR calc Af Amer 08/02/2017 >60  >60 mL/min Final   Comment: (NOTE) The eGFR has been calculated using the CKD EPI equation. This calculation has not been validated in all clinical situations. eGFR's persistently <60 mL/min signify possible Chronic Kidney Disease.   Georgiann Hahn gap 08/02/2017 5  5 - 15 Final  Hospital Outpatient Visit on 07/27/2017  Component Date Value Ref Range Status  . aPTT 07/27/2017 30  24 - 36 seconds Final  . WBC 07/27/2017 8.9  4.0 - 10.5 K/uL Final  . RBC 07/27/2017 4.86  4.22 - 5.81 MIL/uL Final  . Hemoglobin 07/27/2017 15.3  13.0 - 17.0 g/dL Final  . HCT 07/27/2017 44.6  39.0 - 52.0 % Final  . MCV 07/27/2017 91.8  78.0 - 100.0 fL Final  . MCH 07/27/2017 31.5  26.0 - 34.0 pg Final  . MCHC 07/27/2017 34.3  30.0 - 36.0 g/dL Final  . RDW 07/27/2017 13.2  11.5 - 15.5 % Final  . Platelets 07/27/2017 246  150 - 400  K/uL Final  . Sodium 07/27/2017 139  135 - 145 mmol/L Final  . Potassium 07/27/2017 4.6  3.5 - 5.1 mmol/L Final  . Chloride 07/27/2017 104  101 - 111 mmol/L Final  . CO2 07/27/2017 28  22 - 32 mmol/L Final  . Glucose, Bld 07/27/2017 101* 65 - 99 mg/dL Final  . BUN 07/27/2017 20  6 - 20 mg/dL Final  . Creatinine, Ser 07/27/2017 0.98  0.61 - 1.24 mg/dL Final  . Calcium 07/27/2017 9.4  8.9 - 10.3 mg/dL Final  . Total Protein 07/27/2017 6.8  6.5 - 8.1 g/dL Final  . Albumin 07/27/2017 4.1  3.5 - 5.0 g/dL Final  . AST 07/27/2017 21  15 - 41 U/L Final  . ALT 07/27/2017 22  17 - 63 U/L Final  . Alkaline Phosphatase 07/27/2017 68  38 - 126 U/L Final  . Total Bilirubin 07/27/2017 0.5  0.3 - 1.2 mg/dL Final  . GFR calc  non Af Amer 07/27/2017 >60  >60 mL/min Final  . GFR calc Af Amer 07/27/2017 >60  >60 mL/min Final   Comment: (NOTE) The eGFR has been calculated using the CKD EPI equation. This calculation has not been validated in all clinical situations. eGFR's persistently <60 mL/min signify possible Chronic Kidney Disease.   . Anion gap 07/27/2017 7  5 - 15 Final  . Prothrombin Time 07/27/2017 11.8  11.4 - 15.2 seconds Final  . INR 07/27/2017 0.87   Final  . ABO/RH(D) 07/27/2017 O POS   Final  . Antibody Screen 07/27/2017 NEG   Final  . Sample Expiration 07/27/2017 08/04/2017   Final  . Extend sample reason 07/27/2017 NO TRANSFUSIONS OR PREGNANCY IN THE PAST 3 MONTHS   Final  . MRSA, PCR 07/27/2017 NEGATIVE  NEGATIVE Final  . Staphylococcus aureus 07/27/2017 NEGATIVE  NEGATIVE Final   Comment: (NOTE) The Xpert SA Assay (FDA approved for NASAL specimens in patients 73 years of age and older), is one component of a comprehensive surveillance program. It is not intended to diagnose infection nor to guide or monitor treatment.   . ABO/RH(D) 07/27/2017 O POS   Final     X-Rays:No results found.  EKG: Orders placed or performed in visit on 05/10/17  . EKG 12-Lead     Hospital Course: Miguel Benjamin is a 61 y.o. who was admitted to [facility]. They were brought to the operating room on 08/01/2017 and underwent Procedure(s): LEFT TOTAL KNEE ARTHROPLASTY.  Patient tolerated the procedure well and was later transferred to the recovery room and then to the orthopaedic floor for postoperative care.  They were given PO and IV analgesics for pain control following their surgery.  They were given 24 hours of postoperative antibiotics of  Anti-infectives (From admission, onward)   Start     Dose/Rate Route Frequency Ordered Stop   08/01/17 1400  ceFAZolin (ANCEF) IVPB 2g/100 mL premix     2 g 200 mL/hr over 30 Minutes Intravenous Every 6 hours 08/01/17 1007 08/01/17 2042   08/01/17 0630   ceFAZolin (ANCEF) 2-4 GM/100ML-% IVPB    Comments:  Octavio Graves   : cabinet override      08/01/17 0630 08/01/17 0722   08/01/17 0535  ceFAZolin (ANCEF) IVPB 2g/100 mL premix     2 g 200 mL/hr over 30 Minutes Intravenous On call to O.R. 08/01/17 2703 08/01/17 0742     and started on DVT prophylaxis in the form of Xarelto.   PT and OT were  ordered for total joint protocol.  Discharge planning consulted to help with postop disposition and equipment needs.  Patient had a tough night on the evening of surgery and sore form therapy.  They started to get up OOB with therapy on day one. Hemovac drain was pulled without difficulty.  Continued to work with therapy into day two.  Dressing was changed on day two and the incision was healing well.  Patient was seen in rounds on day two and was ready to go home.  Diet - Regular diet Follow up - in 2 weeks Activity - WBAT Disposition - Home Condition Upon Discharge - Good D/C Meds - See DC Summary DVT Prophylaxis - Xarelto   Discharge Instructions    Call MD / Call 911   Complete by:  As directed    If you experience chest pain or shortness of breath, CALL 911 and be transported to the hospital emergency room.  If you develope a fever above 101 F, pus (white drainage) or increased drainage or redness at the wound, or calf pain, call your surgeon's office.   Change dressing   Complete by:  As directed    Change dressing daily with sterile 4 x 4 inch gauze dressing and apply TED hose. Do not submerge the incision under water.   Constipation Prevention   Complete by:  As directed    Drink plenty of fluids.  Prune juice may be helpful.  You may use a stool softener, such as Colace (over the counter) 100 mg twice a day.  Use MiraLax (over the counter) for constipation as needed.   Diet general   Complete by:  As directed    Discharge instructions   Complete by:  As directed    Take Xarelto for two and a half more weeks, then discontinue  Xarelto. Once the patient has completed the blood thinner regimen, then take a Baby 81 mg Aspirin daily for three more weeks.   Pick up stool softner and laxative for home use following surgery while on pain medications. Do not submerge incision under water. Please use good hand washing techniques while changing dressing each day. May shower starting three days after surgery. Please use a clean towel to pat the incision dry following showers. Continue to use ice for pain and swelling after surgery. Do not use any lotions or creams on the incision until instructed by your surgeon.  Wear both TED hose on both legs during the day every day for three weeks, but may remove the TED hose at night at home.  Postoperative Constipation Protocol  Constipation - defined medically as fewer than three stools per week and severe constipation as less than one stool per week.  One of the most common issues patients have following surgery is constipation.  Even if you have a regular bowel pattern at home, your normal regimen is likely to be disrupted due to multiple reasons following surgery.  Combination of anesthesia, postoperative narcotics, change in appetite and fluid intake all can affect your bowels.  In order to avoid complications following surgery, here are some recommendations in order to help you during your recovery period.  Colace (docusate) - Pick up an over-the-counter form of Colace or another stool softener and take twice a day as long as you are requiring postoperative pain medications.  Take with a full glass of water daily.  If you experience loose stools or diarrhea, hold the colace until you stool forms back up.  If your symptoms  do not get better within 1 week or if they get worse, check with your doctor.  Dulcolax (bisacodyl) - Pick up over-the-counter and take as directed by the product packaging as needed to assist with the movement of your bowels.  Take with a full glass of water.   Use this product as needed if not relieved by Colace only.   MiraLax (polyethylene glycol) - Pick up over-the-counter to have on hand.  MiraLax is a solution that will increase the amount of water in your bowels to assist with bowel movements.  Take as directed and can mix with a glass of water, juice, soda, coffee, or tea.  Take if you go more than two days without a movement. Do not use MiraLax more than once per day. Call your doctor if you are still constipated or irregular after using this medication for 7 days in a row.  If you continue to have problems with postoperative constipation, please contact the office for further assistance and recommendations.  If you experience "the worst abdominal pain ever" or develop nausea or vomiting, please contact the office immediatly for further recommendations for treatment.   Do not put a pillow under the knee. Place it under the heel.   Complete by:  As directed    Do not sit on low chairs, stoools or toilet seats, as it may be difficult to get up from low surfaces   Complete by:  As directed    Driving restrictions   Complete by:  As directed    No driving until released by the physician.   Increase activity slowly as tolerated   Complete by:  As directed    Lifting restrictions   Complete by:  As directed    No lifting until released by the physician.   Patient may shower   Complete by:  As directed    You may shower without a dressing once there is no drainage.  Do not wash over the wound.  If drainage remains, do not shower until drainage stops.   TED hose   Complete by:  As directed    Use stockings (TED hose) for 3 weeks on both leg(s).  You may remove them at night for sleeping.   Weight bearing as tolerated   Complete by:  As directed    Laterality:  left   Extremity:  Lower     Allergies as of 08/02/2017   No Known Allergies     Medication List    STOP taking these medications   cyanocobalamin 1000 MCG tablet   D 5000 5000  units capsule Generic drug:  Cholecalciferol   GLUCOSAMINE CHOND DOUBLE STR PO   naproxen sodium 220 MG tablet Commonly known as:  ALEVE   OMEGA-3 FISH OIL PO     TAKE these medications   methocarbamol 500 MG tablet Commonly known as:  ROBAXIN Take 1 tablet (500 mg total) by mouth every 6 (six) hours as needed for muscle spasms.   nicotine 21 mg/24hr patch Commonly known as:  NICODERM CQ - dosed in mg/24 hours Place 21 mg onto the skin daily.   oxyCODONE 5 MG immediate release tablet Commonly known as:  Oxy IR/ROXICODONE Take 1-2 tablets (5-10 mg total) by mouth every 4 (four) hours as needed for moderate pain or severe pain.   rivaroxaban 10 MG Tabs tablet Commonly known as:  XARELTO Take 1 tablet (10 mg total) by mouth daily with breakfast. Take Xarelto for two and a half more  weeks following discharge from the hospital, then discontinue Xarelto. Once the patient has completed the blood thinner regimen, then take a Baby 81 mg Aspirin daily for three more weeks. Start taking on:  08/03/2017   traMADol 50 MG tablet Commonly known as:  ULTRAM Take 1-2 tablets (50-100 mg total) by mouth every 6 (six) hours as needed for moderate pain.            Discharge Care Instructions  (From admission, onward)        Start     Ordered   08/02/17 0000  Weight bearing as tolerated    Question Answer Comment  Laterality left   Extremity Lower      08/02/17 0841   08/02/17 0000  Change dressing    Comments:  Change dressing daily with sterile 4 x 4 inch gauze dressing and apply TED hose. Do not submerge the incision under water.   08/02/17 0841     Follow-up Information    Gaynelle Arabian, MD. Schedule an appointment as soon as possible for a visit on 08/18/2017.   Specialty:  Orthopedic Surgery Contact information: [address] [place] [address] [phone]           Signed: Arlee Muslim, PA-C Orthopaedic Surgery 08/02/2017, 8:42  AM

## 2017-08-02 NOTE — Progress Notes (Signed)
Physical Therapy Treatment Patient Details Name: Miguel Benjamin MRN: [id number] DOB: 23-Apr-1956 Today's Date: 08/02/2017    History of Present Illness L TKA    PT Comments    Pt progressing well with mobility, he ambulated 200' with RW and performed TKA exercises with min A.    Follow Up Recommendations  Outpatient PT;DC plan and follow up therapy as arranged by surgeon(outpt appt on Thurs)     Equipment Recommendations  3in1 (PT);Rolling walker with 5" wheels    Recommendations for Other Services       Precautions / Restrictions Precautions Precautions: Knee Precaution Booklet Issued: Yes (comment) Precaution Comments: reviewed no pillow under knee Required Braces or Orthoses: Knee Immobilizer - Left Knee Immobilizer - Left: Discontinue once straight leg raise with < 10 degree lag Restrictions Weight Bearing Restrictions: No Other Position/Activity Restrictions: WBAT    Mobility  Bed Mobility Overal bed mobility: Modified Independent             General bed mobility comments: self assisted LLE with RLE, HOB up 30*  Transfers Overall transfer level: Needs assistance Equipment used: Rolling walker (2 wheeled) Transfers: Sit to/from Stand Sit to Stand: Modified independent (Device/Increase time)            Ambulation/Gait Ambulation/Gait assistance: Min guard Ambulation Distance (Feet): 200 Feet Assistive device: Rolling walker (2 wheeled) Gait Pattern/deviations: Step-to pattern;Step-through pattern;Decreased stride length;Decreased weight shift to left   Gait velocity interpretation: at or above normal speed for age/gender General Gait Details:  steady with RW, no LOB   Stairs            Wheelchair Mobility    Modified Rankin (Stroke Patients Only)       Balance Overall balance assessment: Modified Independent                                          Cognition Arousal/Alertness: Awake/alert Behavior During  Therapy: WFL for tasks assessed/performed Overall Cognitive Status: Within Functional Limits for tasks assessed                                        Exercises Total Joint Exercises Ankle Circles/Pumps: AROM;Both;15 reps Quad Sets: AROM;Both;5 reps;Supine Short Arc Quad: AAROM;Left;10 reps Heel Slides: AAROM;Left;10 reps;Supine Hip ABduction/ADduction: AAROM;Left;10 reps;Supine Straight Leg Raises: Left;5 reps;Supine;AAROM Long Arc Quad: AROM;Left;5 reps;Seated Knee Flexion: AAROM;Left;5 reps;Seated Goniometric ROM: 5-55* AAROM L knee    General Comments        Pertinent Vitals/Pain Pain Score: 7  Pain Location: L thigh Pain Descriptors / Indicators: Sore;Guarding Pain Intervention(s): Limited activity within patient's tolerance;Monitored during session;Premedicated before session;Ice applied    Home Living                      Prior Function            PT Goals (current goals can now be found in the care plan section) Acute Rehab PT Goals Patient Stated Goal: tennis and golf PT Goal Formulation: With patient/family Time For Goal Achievement: 08/08/17 Potential to Achieve Goals: Good Progress towards PT goals: Progressing toward goals    Frequency    7X/week      PT Plan Current plan remains appropriate    Co-evaluation  AM-PAC PT "6 Clicks" Daily Activity  Outcome Measure  Difficulty turning over in bed (including adjusting bedclothes, sheets and blankets)?: None Difficulty moving from lying on back to sitting on the side of the bed? : A Little Difficulty sitting down on and standing up from a chair with arms (e.g., wheelchair, bedside commode, etc,.)?: A Little Help needed moving to and from a bed to chair (including a wheelchair)?: A Little Help needed walking in hospital room?: A Little Help needed climbing 3-5 steps with a railing? : A Lot 6 Click Score: 18    End of Session Equipment Utilized During  Treatment: Gait belt;Left knee immobilizer Activity Tolerance: Patient tolerated treatment well Patient left: with call bell/phone within reach;with family/visitor present;in bed Nurse Communication: Mobility status PT Visit Diagnosis: Muscle weakness (generalized) (M62.81);Pain;Difficulty in walking, not elsewhere classified (R26.2) Pain - Right/Left: Left Pain - part of body: Knee     Time: [id number] PT Time Calculation (min) (ACUTE ONLY): 34 min  Charges:  $Gait Training: 8-22 mins $Therapeutic Exercise: 8-22 mins                    G Codes:          Ralene BatheUhlenberg, Briany Aye Kistler 08/02/2017, 2:07 PM [phone]

## 2017-08-03 LAB — BASIC METABOLIC PANEL
ANION GAP: 5 (ref 5–15)
BUN: 10 mg/dL (ref 6–20)
CHLORIDE: 104 mmol/L (ref 101–111)
CO2: 29 mmol/L (ref 22–32)
Calcium: 8.7 mg/dL — ABNORMAL LOW (ref 8.9–10.3)
Creatinine, Ser: 0.89 mg/dL (ref 0.61–1.24)
GFR calc Af Amer: >60 mL/min (ref >60)
GFR calc non Af Amer: >60 mL/min (ref >60)
GLUCOSE: 101 mg/dL — AB (ref 65–99)
POTASSIUM: 3.6 mmol/L (ref 3.5–5.1)
Sodium: 138 mmol/L (ref 135–145)

## 2017-08-03 LAB — CBC
HEMATOCRIT: 33.3 % — AB (ref 39.0–52.0)
Hemoglobin: 11.4 g/dL — ABNORMAL LOW (ref 13.0–17.0)
MCH: 31.2 pg (ref 26.0–34.0)
MCHC: 34.2 g/dL (ref 30.0–36.0)
MCV: 91.2 fL (ref 78.0–100.0)
Platelets: 173 10*3/uL (ref 150–400)
RBC: 3.65 MIL/uL — AB (ref 4.22–5.81)
RDW: 13.5 % (ref 11.5–15.5)
WBC: 11.9 10*3/uL — AB (ref 4.0–10.5)

## 2017-08-03 NOTE — Progress Notes (Signed)
Physical Therapy Treatment Patient Details Name: Miguel Benjamin Delph MRN: [id number] DOB: 07-Oct-1955 Today's Date: 08/03/2017    History of Present Illness L TKA    PT Comments    The patient reports increased pain, inability to perform SLR , and inability to tolerate KI on the knee. He did procatice @ steps with KI. Patient requested to wrap the knee in ACE, RN gave clearance. Plans DC today.   Follow Up Recommendations  Outpatient PT;DC plan and follow up therapy as arranged by surgeon     Equipment Recommendations  3in1 (PT);Rolling walker with 5" wheels    Recommendations for Other Services       Precautions / Restrictions Precautions Precautions: Knee Precaution Booklet Issued: Yes (comment) Precaution Comments: reviewed no pillow under knee Required Braces or Orthoses: Knee Immobilizer - Left Knee Immobilizer - Left: Discontinue once straight leg raise with < 10 degree lag Restrictions LLE Weight Bearing: Weight bearing as tolerated    Mobility  Bed Mobility Overal bed mobility: Modified Independent             General bed mobility comments: self assisted LLE with RLE, HOB up 30*  Transfers Overall transfer level: Needs assistance Equipment used: Rolling walker (2 wheeled) Transfers: Sit to/from Stand Sit to Stand: Modified independent (Device/Increase time) Stand pivot transfers: Supervision       General transfer comment: VC's for technique  Ambulation/Gait Ambulation/Gait assistance: Min guard Ambulation Distance (Feet): 50 Feet Assistive device: Rolling walker (2 wheeled) Gait Pattern/deviations: Step-to pattern;Step-through pattern;Decreased stride length;Decreased weight shift to left     General Gait Details:  steady with RW, no LOB   Stairs Stairs: Yes   Stair Management: No rails;With walker;Backwards Number of Stairs: 2 General stair comments: wife present for instruction  Wheelchair Mobility    Modified Rankin (Stroke  Patients Only)       Balance                                            Cognition Arousal/Alertness: Awake/alert                                            Exercises Total Joint Exercises Quad Sets: AROM;Both;5 reps;Supine Heel Slides: AAROM;Left;10 reps;Supine Straight Leg Raises: Left;Supine;AAROM;10 reps Goniometric ROM: 5-45 left knee    General Comments        Pertinent Vitals/Pain Pain Score: 7  Pain Location: knee with KI, thigh on left Pain Descriptors / Indicators: Sore;Guarding;Aching;Pressure Pain Intervention(s): Limited activity within patient's tolerance;Monitored during session;Premedicated before session;Repositioned;Heat applied    Home Living                      Prior Function            PT Goals (current goals can now be found in the care plan section) Progress towards PT goals: Progressing toward goals    Frequency    7X/week      PT Plan Current plan remains appropriate    Co-evaluation              AM-PAC PT "6 Clicks" Daily Activity  Outcome Measure  Difficulty turning over in bed (including adjusting bedclothes, sheets and blankets)?: None Difficulty moving from lying on back to  sitting on the side of the bed? : None Difficulty sitting down on and standing up from a chair with arms (e.g., wheelchair, bedside commode, etc,.)?: A Little Help needed moving to and from a bed to chair (including a wheelchair)?: A Little Help needed walking in hospital room?: A Little Help needed climbing 3-5 steps with a railing? : A Little 6 Click Score: 20    End of Session Equipment Utilized During Treatment: Left knee immobilizer Activity Tolerance: Patient limited by pain Patient left: with call bell/phone within reach;with family/visitor present;in bed Nurse Communication: Mobility status PT Visit Diagnosis: Muscle weakness (generalized) (M62.81);Pain;Difficulty in walking, not elsewhere  classified (R26.2) Pain - Right/Left: Left Pain - part of body: Knee     Time: 1030-1108 PT Time Calculation (min) (ACUTE ONLY): 38 min  Charges:  $Gait Training: 8-22 mins $Therapeutic Exercise: 8-22 mins $Self Care/Home Management: 8-22                    G CodesBlanchard Kelch:       Whitney Hillegass PT [phone]    Rada HayHill, Arnisha Laffoon Elizabeth 08/03/2017, 1:04 PM

## 2017-08-03 NOTE — Progress Notes (Signed)
   Subjective: 2 Days Post-Op Procedure(s) (LRB): LEFT TOTAL KNEE ARTHROPLASTY (Left) Patient reports pain as mild.   Patient seen in rounds with Dr. Lequita HaltAluisio. Patient is well, and has had no acute complaints or problems Patient is ready to go home following therapy  Objective: Vital signs in last 24 hours: Temp:  [98.1 F (36.7 C)-98.6 F (37 C)] 98.1 F (36.7 C) (12/12 0510) Pulse Rate:  [81-103] 94 (12/12 0510) Resp:  [16-21] 21 (12/12 0510) BP: (119-133)/(74-82) 122/79 (12/12 0510) SpO2:  [98 %-99 %] 98 % (12/12 0510)  Intake/Output from previous day:  Intake/Output Summary (Last 24 hours) at 08/03/2017 0741 Last data filed at 08/03/2017 0511 Gross per 24 hour  Intake 1765 ml  Output 2230 ml  Net -465 ml    Intake/Output this shift: No intake/output data recorded.  Labs: Recent Labs    08/02/17 0551 08/03/17 0540  HGB 11.2* 11.4*   Recent Labs    08/02/17 0551 08/03/17 0540  WBC 12.4* 11.9*  RBC 3.69* 3.65*  HCT 33.8* 33.3*  PLT 170 173   Recent Labs    08/02/17 0551 08/03/17 0540  NA 135 138  K 3.7 3.6  CL 103 104  CO2 27 29  BUN 14 10  CREATININE 0.87 0.89  GLUCOSE 128* 101*  CALCIUM 8.3* 8.7*   No results for input(s): LABPT, INR in the last 72 hours.  EXAM: General - Patient is Alert, Appropriate and Oriented Extremity - Neurovascular intact Sensation intact distally Intact pulses distally Dorsiflexion/Plantar flexion intact Incision - clean, dry Motor Function - intact, moving foot and toes well on exam.   Assessment/Plan: 2 Days Post-Op Procedure(s) (LRB): LEFT TOTAL KNEE ARTHROPLASTY (Left) Procedure(s) (LRB): LEFT TOTAL KNEE ARTHROPLASTY (Left) Past Medical History:  Diagnosis Date  . Allergy   . Arthritis   . COPD (chronic obstructive pulmonary disease) (HCC)    Principal Problem:   OA (osteoarthritis) of knee  Estimated body mass index is 25.23 kg/m as calculated from the following:   Height as of this encounter: 6'  (1.829 m).   Weight as of this encounter: 84.4 kg (186 lb). Up with therapy Diet - Regular diet Follow up - in 2 weeks Activity - WBAT Disposition - Home Condition Upon Discharge - Good D/C Meds - See DC Summary DVT Prophylaxis - Xarelto  Miguel Peacerew [name], PA-C Orthopaedic Surgery 08/03/2017, 7:41 AM

## 2017-08-19 ENCOUNTER — Encounter: Payer: Self-pay | Admitting: Gastroenterology

## 2017-10-11 ENCOUNTER — Ambulatory Visit (INDEPENDENT_AMBULATORY_CARE_PROVIDER_SITE_OTHER): Payer: Managed Care, Other (non HMO) | Admitting: Family Medicine

## 2017-10-11 ENCOUNTER — Encounter: Payer: Self-pay | Admitting: Family Medicine

## 2017-10-11 VITALS — BP 110/70 | HR 107 | Temp 98.4°F | Wt 185.0 lb

## 2017-10-11 DIAGNOSIS — R251 Tremor, unspecified: Secondary | ICD-10-CM | POA: Diagnosis not present

## 2017-10-11 DIAGNOSIS — J069 Acute upper respiratory infection, unspecified: Secondary | ICD-10-CM | POA: Diagnosis not present

## 2017-10-11 DIAGNOSIS — R61 Generalized hyperhidrosis: Secondary | ICD-10-CM | POA: Diagnosis not present

## 2017-10-11 LAB — CBC WITH DIFFERENTIAL/PLATELET
BASOS ABS: 0 10*3/uL (ref 0.0–0.1)
BASOS PCT: 0.8 % (ref 0.0–3.0)
Eosinophils Absolute: 0 10*3/uL (ref 0.0–0.7)
Eosinophils Relative: 0.6 % (ref 0.0–5.0)
HEMATOCRIT: 43.7 % (ref 39.0–52.0)
Hemoglobin: 14.6 g/dL (ref 13.0–17.0)
LYMPHS ABS: 1 10*3/uL (ref 0.7–4.0)
Lymphocytes Relative: 18.3 % (ref 12.0–46.0)
MCHC: 33.3 g/dL (ref 30.0–36.0)
MCV: 91.6 fl (ref 78.0–100.0)
MONOS PCT: 11.9 % (ref 3.0–12.0)
Monocytes Absolute: 0.7 10*3/uL (ref 0.1–1.0)
NEUTROS ABS: 3.8 10*3/uL (ref 1.4–7.7)
NEUTROS PCT: 68.4 % (ref 43.0–77.0)
PLATELETS: 225 10*3/uL (ref 150.0–400.0)
RBC: 4.77 Mil/uL (ref 4.22–5.81)
RDW: 13.7 % (ref 11.5–15.5)
WBC: 5.5 10*3/uL (ref 4.0–10.5)

## 2017-10-11 LAB — COMPREHENSIVE METABOLIC PANEL
ALBUMIN: 4.2 g/dL (ref 3.5–5.2)
ALT: 14 U/L (ref 0–53)
AST: 14 U/L (ref 0–37)
Alkaline Phosphatase: 66 U/L (ref 39–117)
BUN: 20 mg/dL (ref 6–23)
CO2: 29 meq/L (ref 19–32)
Calcium: 9.7 mg/dL (ref 8.4–10.5)
Chloride: 102 mEq/L (ref 96–112)
Creatinine, Ser: 1.02 mg/dL (ref 0.40–1.50)
GFR: 78.82 mL/min (ref >60.00)
Glucose, Bld: 101 mg/dL — ABNORMAL HIGH (ref 70–99)
POTASSIUM: 4.6 meq/L (ref 3.5–5.1)
Sodium: 139 mEq/L (ref 135–145)
Total Bilirubin: 0.4 mg/dL (ref 0.2–1.2)
Total Protein: 6.5 g/dL (ref 6.0–8.3)

## 2017-10-11 LAB — TSH: TSH: 1.15 u[IU]/mL (ref 0.35–4.50)

## 2017-10-11 NOTE — Patient Instructions (Signed)
Upper Respiratory Infection, Adult Most upper respiratory infections (URIs) are a viral infection of the air passages leading to the lungs. A URI affects the nose, throat, and upper air passages. The most common type of URI is nasopharyngitis and is typically referred to as "the common cold." URIs run their course and usually go away on their own. Most of the time, a URI does not require medical attention, but sometimes a bacterial infection in the upper airways can follow a viral infection. This is called a secondary infection. Sinus and middle ear infections are common types of secondary upper respiratory infections. Bacterial pneumonia can also complicate a URI. A URI can worsen asthma and chronic obstructive pulmonary disease (COPD). Sometimes, these complications can require emergency medical care and may be life threatening. What are the causes? Almost all URIs are caused by viruses. A virus is a type of germ and can spread from one person to another. What increases the risk? You may be at risk for a URI if:  You smoke.  You have chronic heart or lung disease.  You have a weakened defense (immune) system.  You are very young or very old.  You have nasal allergies or asthma.  You work in crowded or poorly ventilated areas.  You work in health care facilities or schools.  What are the signs or symptoms? Symptoms typically develop 2-3 days after you come in contact with a cold virus. Most viral URIs last 7-10 days. However, viral URIs from the influenza virus (flu virus) can last 14-18 days and are typically more severe. Symptoms may include:  Runny or stuffy (congested) nose.  Sneezing.  Cough.  Sore throat.  Headache.  Fatigue.  Fever.  Loss of appetite.  Pain in your forehead, behind your eyes, and over your cheekbones (sinus pain).  Muscle aches.  How is this diagnosed? Your health care provider may diagnose a URI by:  Physical exam.  Tests to check that your  symptoms are not due to another condition such as: ? Strep throat. ? Sinusitis. ? Pneumonia. ? Asthma.  How is this treated? A URI goes away on its own with time. It cannot be cured with medicines, but medicines may be prescribed or recommended to relieve symptoms. Medicines may help:  Reduce your fever.  Reduce your cough.  Relieve nasal congestion.  Follow these instructions at home:  Take medicines only as directed by your health care provider.  Gargle warm saltwater or take cough drops to comfort your throat as directed by your health care provider.  Use a warm mist humidifier or inhale steam from a shower to increase air moisture. This may make it easier to breathe.  Drink enough fluid to keep your urine clear or pale yellow.  Eat soups and other clear broths and maintain good nutrition.  Rest as needed.  Return to work when your temperature has returned to normal or as your health care provider advises. You may need to stay home longer to avoid infecting others. You can also use a face mask and careful hand washing to prevent spread of the virus.  Increase the usage of your inhaler if you have asthma.  Do not use any tobacco products, including cigarettes, chewing tobacco, or electronic cigarettes. If you need help quitting, ask your health care provider. How is this prevented? The best way to protect yourself from getting a cold is to practice good hygiene.  Avoid oral or hand contact with people with cold symptoms.  Wash your   hands often if contact occurs.  There is no clear evidence that vitamin C, vitamin E, echinacea, or exercise reduces the chance of developing a cold. However, it is always recommended to get plenty of rest, exercise, and practice good nutrition. Contact a health care provider if:  You are getting worse rather than better.  Your symptoms are not controlled by medicine.  You have chills.  You have worsening shortness of breath.  You have  brown or red mucus.  You have yellow or brown nasal discharge.  You have pain in your face, especially when you bend forward.  You have a fever.  You have swollen neck glands.  You have pain while swallowing.  You have white areas in the back of your throat. Get help right away if:  You have severe or persistent: ? Headache. ? Ear pain. ? Sinus pain. ? Chest pain.  You have chronic lung disease and any of the following: ? Wheezing. ? Prolonged cough. ? Coughing up blood. ? A change in your usual mucus.  You have a stiff neck.  You have changes in your: ? Vision. ? Hearing. ? Thinking. ? Mood. This information is not intended to replace advice given to you by your health care provider. Make sure you discuss any questions you have with your health care provider. Document Released: 02/02/2001 Document Revised: 04/11/2016 Document Reviewed: 11/14/2013 Elsevier Interactive Patient Education  2018 Elsevier Inc.  

## 2017-10-11 NOTE — Progress Notes (Signed)
Subjective:     Patient ID: Miguel Benjamin, male   DOB: 1955-11-16, 62 y.o.   MRN: [id number]  HPI Patient seen for the following issues  Acute issue upper respiratory symptoms. Started just last night with sore throat along with some cough and nasal congestion. No fever. No significant body aches. No nausea or vomiting. No diarrhea.  Patient relates night sweats going back until just after Christmas. He had left total knee replacement and ever since then has had some intermittent night sweats. Not consistently each night. Denies any appetite or weight changes. No documented fever. No chills. He had normal TSH back in September. Has not had any signs of knee infection. No chronic cough. No hemoptysis.  Past Medical History:  Diagnosis Date  . Allergy   . Arthritis   . COPD (chronic obstructive pulmonary disease) (HCC)    Past Surgical History:  Procedure Laterality Date  . HERNIA REPAIR    . knee reconstruction on right knee    . SHOULDER SURGERY    . TONSILLECTOMY    . TOTAL KNEE ARTHROPLASTY Left 08/01/2017   Procedure: LEFT TOTAL KNEE ARTHROPLASTY;  Surgeon: Ollen GrossAluisio, Frank, MD;  Location: [facility];  Service: Orthopedics;  Laterality: Left;  Adductor Block    reports that he has been smoking.  He has a 30.00 pack-year smoking history. he has never used smokeless tobacco. He reports that he drinks alcohol. He reports that he does not use drugs. family history includes Cancer in his father; Cardiomyopathy in his father; Hyperthyroidism in his mother; Lung cancer in his mother. No Known Allergies   Review of Systems  Constitutional: Negative for appetite change, chills, fever and unexpected weight change.  HENT: Positive for congestion and sore throat.   Respiratory: Positive for cough.   Cardiovascular: Negative for chest pain, palpitations and leg swelling.  Gastrointestinal: Negative for abdominal pain, diarrhea, nausea and vomiting.  Genitourinary: Negative for dysuria.   Skin: Negative for rash.  Hematological: Negative for adenopathy.       Objective:   Physical Exam  Constitutional: He appears well-developed and well-nourished.  HENT:  Right Ear: External ear normal.  Left Ear: External ear normal.  Minimal posterior pharynx erythema. No exudate.  Neck: Neck supple.  Cardiovascular: Normal rate.  Slightly tachycardic with rate about 105-the regular rhythm  Pulmonary/Chest: Effort normal and breath sounds normal. No respiratory distress. He has no wheezes. He has no rales.  Musculoskeletal: He exhibits no edema.  Lymphadenopathy:    He has no cervical adenopathy.  Skin: No rash noted.       Assessment:     #1 probable viral URI  #2 intermittent night sweats for 3 months now following left total knee replacement. He does have subtle tremor involving the head and neck and upper extremity bilaterally which he had not noted and also slightly tachycardic. Does not have any diarrhea or weight loss.    Plan:     -Check TSH, comprehensive metabolic panel, CBC with differential -Treat viral symptoms symptomatically with plenty fluids and rest -Consider gradually reducing caffeine consumption to see if tremor improves with this -Follow-up for any persistent or worsening tremor  Kristian CoveyBruce W Daelynn Blower MD [facility]

## 2018-06-09 ENCOUNTER — Telehealth: Payer: Self-pay

## 2018-06-09 ENCOUNTER — Other Ambulatory Visit (INDEPENDENT_AMBULATORY_CARE_PROVIDER_SITE_OTHER): Payer: Managed Care, Other (non HMO)

## 2018-06-09 DIAGNOSIS — R972 Elevated prostate specific antigen [PSA]: Secondary | ICD-10-CM | POA: Diagnosis not present

## 2018-06-09 LAB — PSA: PSA: 2.93 ng/mL (ref 0.10–4.00)

## 2018-06-09 NOTE — Telephone Encounter (Signed)
Called patient and let him know that he can get his labs done at the time of his physical as Dr. Caryl Never is out of the office this week and I was off yesterday and just received this message. Patient verbalized an understanding and stated that he has already been here to have his PSA lab done.  Copied from CRM [id number]. Topic: General - Other >> Jun 08, 2018 10:30 AM Miguel Benjamin wrote: Reason for CRM:   Pt wants to know if he can have labs for CPE done at lab for PSA tomorrow.  Pt can be reached at [phone] >> Jun 08, 2018 10:53 AM Cox, Miguel Benjamin, CMA wrote: There is no PSA order in chart. CPE labs would be too soon and insurance will not cover, must now be done same day per policy.

## 2018-06-11 ENCOUNTER — Encounter: Payer: Self-pay | Admitting: Family Medicine

## 2018-07-03 ENCOUNTER — Other Ambulatory Visit: Payer: Self-pay

## 2018-07-03 ENCOUNTER — Encounter: Payer: Self-pay | Admitting: Family Medicine

## 2018-07-03 ENCOUNTER — Ambulatory Visit (INDEPENDENT_AMBULATORY_CARE_PROVIDER_SITE_OTHER): Payer: Managed Care, Other (non HMO) | Admitting: Family Medicine

## 2018-07-03 VITALS — BP 122/78 | HR 97 | Temp 98.4°F | Ht 72.25 in | Wt 182.7 lb

## 2018-07-03 DIAGNOSIS — Z0000 Encounter for general adult medical examination without abnormal findings: Secondary | ICD-10-CM | POA: Diagnosis not present

## 2018-07-03 LAB — LIPID PANEL
Cholesterol: 232 mg/dL — ABNORMAL HIGH (ref 0–200)
HDL: 104.7 mg/dL (ref >39.00)
LDL CALC: 117 mg/dL — AB (ref 0–99)
NONHDL: 127.19
Total CHOL/HDL Ratio: 2
Triglycerides: 52 mg/dL (ref 0.0–149.0)
VLDL: 10.4 mg/dL (ref 0.0–40.0)

## 2018-07-03 LAB — HEPATIC FUNCTION PANEL
ALT: 19 U/L (ref 0–53)
AST: 22 U/L (ref 0–37)
Albumin: 4.6 g/dL (ref 3.5–5.2)
Alkaline Phosphatase: 63 U/L (ref 39–117)
BILIRUBIN TOTAL: 0.6 mg/dL (ref 0.2–1.2)
Bilirubin, Direct: 0.1 mg/dL (ref 0.0–0.3)
Total Protein: 6.8 g/dL (ref 6.0–8.3)

## 2018-07-03 LAB — CBC WITH DIFFERENTIAL/PLATELET
Basophils Absolute: 0 10*3/uL (ref 0.0–0.1)
Basophils Relative: 0.6 % (ref 0.0–3.0)
EOS ABS: 0 10*3/uL (ref 0.0–0.7)
Eosinophils Relative: 0.5 % (ref 0.0–5.0)
HCT: 46 % (ref 39.0–52.0)
HEMOGLOBIN: 15.5 g/dL (ref 13.0–17.0)
Lymphocytes Relative: 15.8 % (ref 12.0–46.0)
Lymphs Abs: 1.1 10*3/uL (ref 0.7–4.0)
MCHC: 33.7 g/dL (ref 30.0–36.0)
MCV: 91.4 fl (ref 78.0–100.0)
MONO ABS: 0.6 10*3/uL (ref 0.1–1.0)
Monocytes Relative: 7.8 % (ref 3.0–12.0)
NEUTROS PCT: 75.3 % (ref 43.0–77.0)
Neutro Abs: 5.3 10*3/uL (ref 1.4–7.7)
Platelets: 240 10*3/uL (ref 150.0–400.0)
RBC: 5.03 Mil/uL (ref 4.22–5.81)
RDW: 13.3 % (ref 11.5–15.5)
WBC: 7.1 10*3/uL (ref 4.0–10.5)

## 2018-07-03 LAB — TSH: TSH: 0.7 u[IU]/mL (ref 0.35–4.50)

## 2018-07-03 LAB — BASIC METABOLIC PANEL
BUN: 25 mg/dL — AB (ref 6–23)
CHLORIDE: 103 meq/L (ref 96–112)
CO2: 27 mEq/L (ref 19–32)
Calcium: 10 mg/dL (ref 8.4–10.5)
Creatinine, Ser: 1.08 mg/dL (ref 0.40–1.50)
GFR: 73.62 mL/min (ref >60.00)
GLUCOSE: 120 mg/dL — AB (ref 70–99)
Potassium: 5.3 mEq/L — ABNORMAL HIGH (ref 3.5–5.1)
Sodium: 138 mEq/L (ref 135–145)

## 2018-07-03 NOTE — Progress Notes (Signed)
Subjective:     Patient ID: Miguel Benjamin, male   DOB: 1956-08-17, 62 y.o.   MRN: [id number]  HPI Patient is seen for physical exam.  Generally very healthy.  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  Unfortunately he has had  stress recently of his wife of 34 years leaving him for the past 2 months.  They have not decided on divorce at this point.  Patient needs repeat colonoscopy.  He is actually overdue.  He had elevated PSA last year.  Follow-up was 2.9 after elevation over 4-year ago.  He has occasional slow stream and occasional frequency.  No consistent slow stream.  Immunizations up-to-date.  He had shingles vaccine last year.  Past Medical History:  Diagnosis Date  . Allergy   . Arthritis   . COPD (chronic obstructive pulmonary disease) (HCC)    Past Surgical History:  Procedure Laterality Date  . HERNIA REPAIR    . knee reconstruction on right knee    . SHOULDER SURGERY    . TONSILLECTOMY    . TOTAL KNEE ARTHROPLASTY Left 08/01/2017   Procedure: LEFT TOTAL KNEE ARTHROPLASTY;  Surgeon: Ollen Gross, MD;  Location: [facility];  Service: Orthopedics;  Laterality: Left;  Adductor Block    reports that he has quit smoking. He smoked 0.00 packs per day for 30.00 years. He has never used smokeless tobacco. He reports that he drinks alcohol. He reports that he does not use drugs. family history includes Cancer in his father; Cardiomyopathy in his father; Hyperthyroidism in his mother; Lung cancer in his mother. No Known Allergies   Review of Systems  Constitutional: Negative for activity change, appetite change, fatigue, fever and unexpected weight change.  HENT: Negative for congestion, ear pain and trouble swallowing.   Eyes: Negative for pain and visual disturbance.  Respiratory: Negative for cough, shortness of breath and wheezing.   Cardiovascular: Negative for chest pain and  palpitations.  Gastrointestinal: Negative for abdominal distention, abdominal pain, blood in stool, constipation, diarrhea, nausea, rectal pain and vomiting.  Genitourinary: Negative for dysuria, hematuria and testicular pain.  Musculoskeletal: Negative for arthralgias and joint swelling.  Skin: Negative for rash.  Neurological: Negative for dizziness, syncope and headaches.  Hematological: Negative for adenopathy.  Psychiatric/Behavioral: Negative for confusion and dysphoric mood.       Objective:   Physical Exam  Constitutional: He is oriented to person, place, and time. He appears well-developed and well-nourished. No distress.  HENT:  Head: Normocephalic and atraumatic.  Right Ear: External ear normal.  Left Ear: External ear normal.  Mouth/Throat: Oropharynx is clear and moist.  Eyes: Pupils are equal, round, and reactive to light. Conjunctivae and EOM are normal.  Neck: Normal range of motion. Neck supple. No thyromegaly present.  Cardiovascular: Normal rate, regular rhythm and normal heart sounds.  No murmur heard. Pulmonary/Chest: No respiratory distress. He has no wheezes. He has no rales.  Abdominal: Soft. Bowel sounds are normal. He exhibits no distension and no mass. There is no tenderness. There is no rebound and no guarding.  Musculoskeletal: He exhibits no edema.  Lymphadenopathy:    He has no cervical adenopathy.  Neurological: He is alert and oriented to person, place, and time. He displays normal reflexes. No cranial nerve deficit.  Is noted to have faint tremor involving the head neck and upper extremities.  Does not extinguish with movement  Skin: No rash  noted.  Psychiatric: He has a normal mood and affect.       Assessment:     Physical exam.  Situational stressor as above.  Patient has fine tremor which is likely essential tremor.  Does not extinguish with movement.  Needs follow-up colonoscopy.  The following health maintenance issues were addressed     Plan:     -Flu vaccine already given -Obtain lab work.  We will not repeat PSA since this was just done a month ago -Set up repeat colonoscopy -Shingles vaccine already given -Continue regular exercise habits -Consider trial of Flomax for any progressive slow stream issues -Offered counseling and he is not interested at this time.  Kristian Covey MD  Primary Care at [facility]

## 2018-07-03 NOTE — Patient Instructions (Signed)
We are setting up repeat colonoscopy.   

## 2018-07-07 ENCOUNTER — Encounter: Payer: Self-pay | Admitting: Family Medicine

## 2018-08-14 ENCOUNTER — Encounter: Payer: Self-pay | Admitting: Family Medicine

## 2019-06-14 ENCOUNTER — Telehealth: Payer: Self-pay | Admitting: *Deleted

## 2019-06-14 NOTE — Telephone Encounter (Signed)
Copied from [place] [id number]. Topic: Appointment Scheduling - Scheduling Inquiry for Clinic >> Jun 14, 2019  4:01 PM Alanda Slim E wrote: Reason for CRM: Pt schedule his CPE for 11.13.19 and would like to have orders for labs to be scheduled in advance /please advise

## 2019-06-15 ENCOUNTER — Encounter: Payer: Self-pay | Admitting: Family Medicine

## 2019-06-15 DIAGNOSIS — Z0000 Encounter for general adult medical examination without abnormal findings: Secondary | ICD-10-CM

## 2019-06-15 NOTE — Telephone Encounter (Signed)
I went ahead and placed future order for labs to get the week before his physical.  The Covid antibody test was placed as well - though not sure if his insurance will OK this as "preventative"

## 2019-06-15 NOTE — Telephone Encounter (Signed)
Pt also sent a mychart message which has already been sent to physician to address

## 2019-06-19 ENCOUNTER — Encounter: Payer: Self-pay | Admitting: Family Medicine

## 2019-06-20 NOTE — Telephone Encounter (Signed)
I have scheduled this patient for his labs on 11/03

## 2019-06-26 ENCOUNTER — Other Ambulatory Visit (INDEPENDENT_AMBULATORY_CARE_PROVIDER_SITE_OTHER): Payer: Managed Care, Other (non HMO)

## 2019-06-26 ENCOUNTER — Telehealth: Payer: Self-pay | Admitting: Family Medicine

## 2019-06-26 ENCOUNTER — Other Ambulatory Visit: Payer: Self-pay

## 2019-06-26 DIAGNOSIS — Z0000 Encounter for general adult medical examination without abnormal findings: Secondary | ICD-10-CM | POA: Diagnosis not present

## 2019-06-26 LAB — CBC WITH DIFFERENTIAL/PLATELET
Basophils Absolute: 0 10*3/uL (ref 0.0–0.1)
Basophils Relative: 0.4 % (ref 0.0–3.0)
Eosinophils Absolute: 0.1 10*3/uL (ref 0.0–0.7)
Eosinophils Relative: 0.9 % (ref 0.0–5.0)
HCT: 46.9 % (ref 39.0–52.0)
Hemoglobin: 15.5 g/dL (ref 13.0–17.0)
Lymphocytes Relative: 14.5 % (ref 12.0–46.0)
Lymphs Abs: 1.1 10*3/uL (ref 0.7–4.0)
MCHC: 33.2 g/dL (ref 30.0–36.0)
MCV: 94.2 fl (ref 78.0–100.0)
Monocytes Absolute: 0.6 10*3/uL (ref 0.1–1.0)
Monocytes Relative: 7.4 % (ref 3.0–12.0)
Neutro Abs: 5.9 10*3/uL (ref 1.4–7.7)
Neutrophils Relative %: 76.8 % (ref 43.0–77.0)
Platelets: 239 10*3/uL (ref 150.0–400.0)
RBC: 4.98 Mil/uL (ref 4.22–5.81)
RDW: 13.2 % (ref 11.5–15.5)
WBC: 7.6 10*3/uL (ref 4.0–10.5)

## 2019-06-26 LAB — BASIC METABOLIC PANEL
BUN: 27 mg/dL — ABNORMAL HIGH (ref 6–23)
CO2: 29 mEq/L (ref 19–32)
Calcium: 9.5 mg/dL (ref 8.4–10.5)
Chloride: 103 mEq/L (ref 96–112)
Creatinine, Ser: 1.08 mg/dL (ref 0.40–1.50)
GFR: 69.04 mL/min (ref >60.00)
Glucose, Bld: 128 mg/dL — ABNORMAL HIGH (ref 70–99)
Potassium: 4.6 mEq/L (ref 3.5–5.1)
Sodium: 139 mEq/L (ref 135–145)

## 2019-06-26 LAB — LIPID PANEL
Cholesterol: 230 mg/dL — ABNORMAL HIGH (ref 0–200)
HDL: 86.2 mg/dL (ref >39.00)
LDL Cholesterol: 133 mg/dL — ABNORMAL HIGH (ref 0–99)
NonHDL: 144.13
Total CHOL/HDL Ratio: 3
Triglycerides: 56 mg/dL (ref 0.0–149.0)
VLDL: 11.2 mg/dL (ref 0.0–40.0)

## 2019-06-26 LAB — PSA: PSA: 3.7 ng/mL (ref 0.10–4.00)

## 2019-06-26 LAB — HEPATIC FUNCTION PANEL
ALT: 21 U/L (ref 0–53)
AST: 19 U/L (ref 0–37)
Albumin: 4.3 g/dL (ref 3.5–5.2)
Alkaline Phosphatase: 64 U/L (ref 39–117)
Bilirubin, Direct: 0.1 mg/dL (ref 0.0–0.3)
Total Bilirubin: 0.4 mg/dL (ref 0.2–1.2)
Total Protein: 6.6 g/dL (ref 6.0–8.3)

## 2019-06-26 LAB — TSH: TSH: 0.85 u[IU]/mL (ref 0.35–4.50)

## 2019-06-26 LAB — SARS-COV-2 IGG: SARS-COV-2 IgG: 0.06

## 2019-06-26 NOTE — Telephone Encounter (Signed)
Called pt and informed him that Dr. Elease Hashimoto was not in the office this week. Pt states he always has his urine done with his physical. Informed pt that I would send message to provider to see if this is something he wants to check. Pt also wanted to know if this needs to be first morning urine if so

## 2019-06-26 NOTE — Telephone Encounter (Signed)
Patient is requesting a call back to clarify his lab work.  He said he gets a urine specimen done every year for his physical labs but this year he didn't.  He wanted to find out why and if he needed to come do that.

## 2019-06-30 NOTE — Telephone Encounter (Signed)
We do not routinely check UA (in absence of symptoms) and I can explain at CPE

## 2019-07-06 ENCOUNTER — Ambulatory Visit (INDEPENDENT_AMBULATORY_CARE_PROVIDER_SITE_OTHER): Payer: Managed Care, Other (non HMO) | Admitting: Family Medicine

## 2019-07-06 ENCOUNTER — Encounter: Payer: Self-pay | Admitting: Family Medicine

## 2019-07-06 ENCOUNTER — Other Ambulatory Visit: Payer: Self-pay

## 2019-07-06 VITALS — BP 122/70 | HR 76 | Temp 98.6°F | Resp 16 | Ht 72.0 in | Wt 188.2 lb

## 2019-07-06 DIAGNOSIS — R739 Hyperglycemia, unspecified: Secondary | ICD-10-CM | POA: Diagnosis not present

## 2019-07-06 DIAGNOSIS — Z0000 Encounter for general adult medical examination without abnormal findings: Secondary | ICD-10-CM | POA: Diagnosis not present

## 2019-07-06 DIAGNOSIS — Z23 Encounter for immunization: Secondary | ICD-10-CM | POA: Diagnosis not present

## 2019-07-06 DIAGNOSIS — R972 Elevated prostate specific antigen [PSA]: Secondary | ICD-10-CM | POA: Diagnosis not present

## 2019-07-06 LAB — POCT GLYCOSYLATED HEMOGLOBIN (HGB A1C): Hemoglobin A1C: 5 % (ref 4.0–5.6)

## 2019-07-06 NOTE — Progress Notes (Signed)
Subjective:     Patient ID: Miguel Benjamin, male   DOB: [birth year], 63 y.o.   MRN: [id number]  HPI   Miguel Benjamin is here for complete physical.  He does not take any medications.  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  He is due for repeat colonoscopy.  He had last colonoscopy 07/25/2012 with recommended 5-year follow-up.  Has not had previous hepatitis C screening here but donates blood regularly.  Last tetanus 2018.  Needs flu vaccine today.  Had recent labs prior to today.  He does have past history of elevated PSA but this has been up and down.  He had PSA 4.24 back in 2018 and 3.70 by recent labs.  Past Medical History:  Diagnosis Date  . Allergy   . Arthritis   . COPD (chronic obstructive pulmonary disease) (HCC)    Past Surgical History:  Procedure Laterality Date  . HERNIA REPAIR    . knee reconstruction on right knee    . SHOULDER SURGERY    . TONSILLECTOMY    . TOTAL KNEE ARTHROPLASTY Left 08/01/2017   Procedure: LEFT TOTAL KNEE ARTHROPLASTY;  Surgeon: Ollen Gross, MD;  Location: [facility];  Service: Orthopedics;  Laterality: Left;  Adductor Block    reports that he has quit smoking. He smoked 0.00 packs per day for 30.00 years. He has never used smokeless tobacco. He reports current alcohol use. He reports that he does not use drugs. family history includes Cancer in his father; Cardiomyopathy in his father; Hyperthyroidism in his mother; Lung cancer in his mother. No Known Allergies  The 10-year ASCVD risk score Denman George DC Jr., et al., 2013) is: 8%   Values used to calculate the score:     Age: 53 years     Sex: Male     Is Non-Hispanic African American: No     Diabetic: No     Tobacco smoker: No     Systolic Blood Pressure: 122 mmHg     Is BP treated: No     HDL Cholesterol: 86.2 mg/dL     Total Cholesterol: 230 mg/dL   Review of Systems  Constitutional: Negative for activity change, appetite change, fatigue and fever.   HENT: Negative for congestion, ear pain and trouble swallowing.   Eyes: Negative for pain and visual disturbance.  Respiratory: Negative for cough, shortness of breath and wheezing.   Cardiovascular: Negative for chest pain and palpitations.  Gastrointestinal: Negative for abdominal distention, abdominal pain, blood in stool, constipation, diarrhea, nausea, rectal pain and vomiting.  Endocrine: Negative for polydipsia and polyuria.  Genitourinary: Negative for dysuria, hematuria and testicular pain.  Musculoskeletal: Negative for arthralgias and joint swelling.  Skin: Negative for rash.  Neurological: Negative for dizziness, syncope and headaches.  Hematological: Negative for adenopathy.  Psychiatric/Behavioral: Negative for confusion and dysphoric mood.       Objective:   Physical Exam Constitutional:      General: He is not in acute distress.    Appearance: He is well-developed.  HENT:     Head: Normocephalic and atraumatic.     Right Ear: External ear normal.     Left Ear: External ear normal.  Eyes:     Conjunctiva/sclera: Conjunctivae normal.     Pupils: Pupils are equal, round, and reactive to light.  Neck:     Musculoskeletal: Normal range of motion and neck supple.     Thyroid: No thyromegaly.  Cardiovascular:  Rate and Rhythm: Normal rate and regular rhythm.     Heart sounds: Normal heart sounds. No murmur.  Pulmonary:     Effort: No respiratory distress.     Breath sounds: No wheezing or rales.  Abdominal:     General: Bowel sounds are normal. There is no distension.     Palpations: Abdomen is soft. There is no mass.     Tenderness: There is no abdominal tenderness. There is no guarding or rebound.  Lymphadenopathy:     Cervical: No cervical adenopathy.  Skin:    Findings: No rash.  Neurological:     Mental Status: He is alert and oriented to person, place, and time.     Cranial Nerves: No cranial nerve deficit.     Deep Tendon Reflexes: Reflexes normal.         Assessment:     Physical exam.  Patient is generally healthy.  He does have history of colon polyps previously.  We discussed the following health maintenance issues    Plan:     -Set up referral for repeat colonoscopy -Continue with yearly PSA screening -Discussed pros and cons of statin use and he declines at this time.  His 10-year risk of CAD event is 8.0%.  He does have very high HDL.  Eulas Post MD [facility] at [facility]

## 2019-07-06 NOTE — Telephone Encounter (Signed)
Pt came for his appt today and provider addressed this

## 2019-07-06 NOTE — Patient Instructions (Signed)
Preventive Care 40-64 Years Old, Male Preventive care refers to lifestyle choices and visits with your health care provider that can promote health and wellness. This includes:  A yearly physical exam. This is also called an annual well check.  Regular dental and eye exams.  Immunizations.  Screening for certain conditions.  Healthy lifestyle choices, such as eating a healthy diet, getting regular exercise, not using drugs or products that contain nicotine and tobacco, and limiting alcohol use. What can I expect for my preventive care visit? Physical exam Your health care provider will check:  Height and weight. These may be used to calculate body mass index (BMI), which is a measurement that tells if you are at a healthy weight.  Heart rate and blood pressure.  Your skin for abnormal spots. Counseling Your health care provider may ask you questions about:  Alcohol, tobacco, and drug use.  Emotional well-being.  Home and relationship well-being.  Sexual activity.  Eating habits.  Work and work environment. What immunizations do I need?  Influenza (flu) vaccine  This is recommended every year. Tetanus, diphtheria, and pertussis (Tdap) vaccine  You may need a Td booster every 10 years. Varicella (chickenpox) vaccine  You may need this vaccine if you have not already been vaccinated. Zoster (shingles) vaccine  You may need this after age 60. Measles, mumps, and rubella (MMR) vaccine  You may need at least one dose of MMR if you were born in 1957 or later. You may also need a second dose. Pneumococcal conjugate (PCV13) vaccine  You may need this if you have certain conditions and were not previously vaccinated. Pneumococcal polysaccharide (PPSV23) vaccine  You may need one or two doses if you smoke cigarettes or if you have certain conditions. Meningococcal conjugate (MenACWY) vaccine  You may need this if you have certain conditions. Hepatitis A vaccine   You may need this if you have certain conditions or if you travel or work in places where you may be exposed to hepatitis A. Hepatitis B vaccine  You may need this if you have certain conditions or if you travel or work in places where you may be exposed to hepatitis B. Haemophilus influenzae type b (Hib) vaccine  You may need this if you have certain risk factors. Human papillomavirus (HPV) vaccine  If recommended by your health care provider, you may need three doses over 6 months. You may receive vaccines as individual doses or as more than one vaccine together in one shot (combination vaccines). Talk with your health care provider about the risks and benefits of combination vaccines. What tests do I need? Blood tests  Lipid and cholesterol levels. These may be checked every 5 years, or more frequently if you are over 50 years old.  Hepatitis C test.  Hepatitis B test. Screening  Lung cancer screening. You may have this screening every year starting at age 55 if you have a 30-pack-year history of smoking and currently smoke or have quit within the past 15 years.  Prostate cancer screening. Recommendations will vary depending on your family history and other risks.  Colorectal cancer screening. All adults should have this screening starting at age 50 and continuing until age 75. Your health care provider may recommend screening at age 45 if you are at increased risk. You will have tests every 1-10 years, depending on your results and the type of screening test.  Diabetes screening. This is done by checking your blood sugar (glucose) after you have not eaten   for a while (fasting). You may have this done every 1-3 years.  Sexually transmitted disease (STD) testing. Follow these instructions at home: Eating and drinking  Eat a diet that includes fresh fruits and vegetables, whole grains, lean protein, and low-fat dairy products.  Take vitamin and mineral supplements as recommended  by your health care provider.  Do not drink alcohol if your health care provider tells you not to drink.  If you drink alcohol: ? Limit how much you have to 0-2 drinks a day. ? Be aware of how much alcohol is in your drink. In the [place] one drink equals one 12 oz bottle of beer (355 mL), one 5 oz glass of wine (148 mL), or one 1 oz glass of hard liquor (44 mL). Lifestyle  Take daily care of your teeth and gums.  Stay active. Exercise for at least 30 minutes on 5 or more days each week.  Do not use any products that contain nicotine or tobacco, such as cigarettes, e-cigarettes, and chewing tobacco. If you need help quitting, ask your health care provider.  If you are sexually active, practice safe sex. Use a condom or other form of protection to prevent STIs (sexually transmitted infections).  Talk with your health care provider about taking a low-dose aspirin every day starting at age 73. What's next?  Go to your health care provider once a year for a well check visit.  Ask your health care provider how often you should have your eyes and teeth checked.  Stay up to date on all vaccines. This information is not intended to replace advice given to you by your health care provider. Make sure you discuss any questions you have with your health care provider. Document Released: 09/05/2015 Document Revised: 08/03/2018 Document Reviewed: 08/03/2018 Elsevier Patient Education  2020 [employer].  Remember to get repeat PSA in 6 months.    We have put in order for repeat colonoscopy.

## 2019-07-23 ENCOUNTER — Other Ambulatory Visit: Payer: Self-pay

## 2019-07-23 DIAGNOSIS — Z20822 Contact with and (suspected) exposure to covid-19: Secondary | ICD-10-CM

## 2019-07-25 LAB — NOVEL CORONAVIRUS, NAA: SARS-CoV-2, NAA: NOT DETECTED

## 2019-08-24 HISTORY — PX: VASECTOMY: SHX75

## 2019-09-06 ENCOUNTER — Encounter: Payer: Self-pay | Admitting: Family Medicine

## 2019-09-28 ENCOUNTER — Encounter: Payer: Self-pay | Admitting: Family Medicine

## 2019-09-29 ENCOUNTER — Encounter: Payer: Self-pay | Admitting: Family Medicine

## 2019-10-01 ENCOUNTER — Encounter: Payer: Self-pay | Admitting: Family Medicine

## 2019-10-01 NOTE — Telephone Encounter (Signed)
Patient picked up form   No charge  

## 2019-10-02 ENCOUNTER — Other Ambulatory Visit: Payer: Self-pay

## 2019-10-02 ENCOUNTER — Telehealth (INDEPENDENT_AMBULATORY_CARE_PROVIDER_SITE_OTHER): Payer: Managed Care, Other (non HMO) | Admitting: Family Medicine

## 2019-10-02 DIAGNOSIS — Z87891 Personal history of nicotine dependence: Secondary | ICD-10-CM

## 2019-10-02 DIAGNOSIS — N529 Male erectile dysfunction, unspecified: Secondary | ICD-10-CM

## 2019-10-02 DIAGNOSIS — R9389 Abnormal findings on diagnostic imaging of other specified body structures: Secondary | ICD-10-CM

## 2019-10-02 NOTE — Progress Notes (Signed)
This visit type was conducted due to national recommendations for restrictions regarding the COVID-19 pandemic in an effort to limit this patient's exposure and mitigate transmission in our community.   Virtual Visit via Video Note  I connected with Miguel Benjamin on 10/02/19 at  2:45 PM EST by a video enabled telemedicine application and verified that I am speaking with the correct person using two identifiers.  Location patient: home Location provider:work or home office Persons participating in the virtual visit: patient, provider  I discussed the limitations of evaluation and management by telemedicine and the availability of in person appointments. The patient expressed understanding and agreed to proceed.   HPI: Miguel Benjamin called for the following issues.  He recently went to apply for long-term care insurance.  His rates were calculated to be significantly elevated because of a diagnosis of "COPD".  He had questions about where this diagnosis came from.  This is on his problem list.  After doing some further searching we found CT scan back in 2008 dated 08/10/2007 with comment from radiology "the lungs are emphysematous".  Under impression there was "emphysema".    He does have history of smoking and quit about 4 years ago after about 15-year history.  He was a fairly light smoker during that time.  He stays very active now and exercises regularly and has no dyspnea whatsoever with fairly intense exercise.  He has never had pulmonary function testing.  He is not aware of any family history of alpha-1 antitrypsin deficiency.  Other issue is occasional intermittent erectile dysfunction.  Currently going through divorce and plans to start dating soon.  His symptoms are inconsistent.  Quit smoking as above.  No history of known peripheral vascular disease.  No nitroglycerin use.  Has used Cialis occasionally in the past   ROS: See pertinent positives and negatives per HPI.  Past Medical History:   Diagnosis Date  . Allergy   . Arthritis   . COPD (chronic obstructive pulmonary disease) ([facility])     Past Surgical History:  Procedure Laterality Date  . HERNIA REPAIR    . knee reconstruction on right knee    . SHOULDER SURGERY    . TONSILLECTOMY    . TOTAL KNEE ARTHROPLASTY Left 08/01/2017   Procedure: LEFT TOTAL KNEE ARTHROPLASTY;  Surgeon: Gaynelle Arabian, MD;  Location: [facility];  Service: Orthopedics;  Laterality: Left;  Adductor Block    Family History  Problem Relation Age of Onset  . Hyperthyroidism Mother   . Lung cancer Mother   . Cardiomyopathy Father   . Cancer Father        bladder  . Colon cancer Neg Hx   . Esophageal cancer Neg Hx   . Stomach cancer Neg Hx   . Rectal cancer Neg Hx     SOCIAL HX: Quit smoking age 29   Current Outpatient Medications:  .  Cholecalciferol (VITAMIN D) 2000 units CAPS, Take by mouth., Disp: , Rfl:  .  cyanocobalamin 500 MCG tablet, Take 500 mcg by mouth daily., Disp: , Rfl:  .  Glucosamine-Chondroit-Vit C-Mn (GLUCOSAMINE 1500 COMPLEX PO), Take by mouth., Disp: , Rfl:  .  Naproxen Sodium (ALEVE PO), Aleve, Disp: , Rfl:  .  Omega-3 Fatty Acids (FISH OIL) 1000 MG CAPS, Take by mouth., Disp: , Rfl:   EXAM:  VITALS per patient if applicable:  GENERAL: alert, oriented, appears well and in no acute distress  HEENT: atraumatic, conjunttiva clear, no obvious abnormalities on inspection of external nose and  ears  NECK: normal movements of the head and neck  LUNGS: on inspection no signs of respiratory distress, breathing rate appears normal, no obvious gross SOB, gasping or wheezing  CV: no obvious cyanosis  MS: moves all visible extremities without noticeable abnormality  PSYCH/NEURO: pleasant and cooperative, no obvious depression or anxiety, speech and thought processing grossly intact  ASSESSMENT AND PLAN:  Discussed the following assessment and plan:  #1 question of emphysema by radiographic appearance of the lungs back  in 2008 with no prior history of pulmonary function testing.  Patient would like to clarify whether he has COPD.  He certainly does not seem to have any limitations in terms of exercise performance at this time.  He is wanting to clarify dx for insurance purposes.    -Set up referral to pulmonary for further evaluation   #2 erectile dysfunction  -Would recommend Cialis 20 mg every other day as needed for ED.  He is not interested in getting prescription yet at this time     I discussed the assessment and treatment plan with the patient. The patient was provided an opportunity to ask questions and all were answered. The patient agreed with the plan and demonstrated an understanding of the instructions.   The patient was advised to call back or seek an in-person evaluation if the symptoms worsen or if the condition fails to improve as anticipated.     Evelena Peat, MD

## 2019-10-05 ENCOUNTER — Other Ambulatory Visit: Payer: Self-pay

## 2019-10-05 ENCOUNTER — Encounter: Payer: Self-pay | Admitting: Family Medicine

## 2019-10-05 MED ORDER — TADALAFIL 20 MG PO TABS: ORAL_TABLET | ORAL | 5 refills | Status: DC

## 2019-10-05 MED ORDER — TADALAFIL 5 MG PO TABS: 5.0000 mg | ORAL_TABLET | Freq: Every day | ORAL | 1 refills | Status: DC | PRN

## 2019-10-05 NOTE — Telephone Encounter (Signed)
Cialis refilled.  I have made referral to pulmonary for possible pulmonary function testing at their discretion

## 2020-02-21 ENCOUNTER — Encounter: Payer: Self-pay | Admitting: Family Medicine

## 2020-02-21 NOTE — Telephone Encounter (Signed)
The daily dose is only FDA approved for 5 mg.  However, I am happy to increase to 10 mg but cannot recommend 10 mg daily.  The 10 mg dose of Cialis is only approved to take one every other day as needed for ED.   Let me know your preference.  With prn medications we usually don't do that volume (ie 90)

## 2020-02-22 MED ORDER — TADALAFIL 5 MG PO TABS: 5.0000 mg | ORAL_TABLET | Freq: Every day | ORAL | 1 refills | Status: DC | PRN

## 2020-02-22 NOTE — Addendum Note (Signed)
Addended by: Kristian Covey on: 02/22/2020 04:41 PM   Modules accepted: Orders

## 2020-02-22 NOTE — Telephone Encounter (Signed)
I refilled the Cialis 5mg  for #90.

## 2020-05-14 ENCOUNTER — Ambulatory Visit (INDEPENDENT_AMBULATORY_CARE_PROVIDER_SITE_OTHER): Payer: Managed Care, Other (non HMO) | Admitting: Family Medicine

## 2020-05-14 ENCOUNTER — Other Ambulatory Visit: Payer: Self-pay

## 2020-05-14 ENCOUNTER — Ambulatory Visit: Payer: Managed Care, Other (non HMO) | Admitting: Family Medicine

## 2020-05-14 VITALS — BP 118/58 | HR 88 | Temp 98.0°F | Wt 186.5 lb

## 2020-05-14 DIAGNOSIS — R05 Cough: Secondary | ICD-10-CM | POA: Diagnosis not present

## 2020-05-14 DIAGNOSIS — R059 Cough, unspecified: Secondary | ICD-10-CM

## 2020-05-14 MED ORDER — DOXYCYCLINE HYCLATE 100 MG PO CAPS: 100.0000 mg | ORAL_CAPSULE | Freq: Two times a day (BID) | ORAL | 0 refills | Status: DC

## 2020-05-14 NOTE — Patient Instructions (Signed)
Acute Bronchitis, Adult  Acute bronchitis is sudden or acute swelling of the air tubes (bronchi) in the lungs. Acute bronchitis causes these tubes to fill with mucus, which can make it hard to breathe. It can also cause coughing or wheezing. In adults, acute bronchitis usually goes away within 2 weeks. A cough caused by bronchitis may last up to 3 weeks. Smoking, allergies, and asthma can make the condition worse. What are the causes? This condition can be caused by germs and by substances that irritate the lungs, including:  Cold and flu viruses. The most common cause of this condition is the virus that causes the common cold.  Bacteria.  Substances that irritate the lungs, including: ? Smoke from cigarettes and other forms of tobacco. ? Dust and pollen. ? Fumes from chemical products, gases, or burned fuel. ? Other materials that pollute indoor or outdoor air.  Close contact with someone who has acute bronchitis. What increases the risk? The following factors may make you more likely to develop this condition:  A weak body's defense system, also called the immune system.  A condition that affects your lungs and breathing, such as asthma. What are the signs or symptoms? Common symptoms of this condition include:  Lung and breathing problems, such as: ? Coughing. This may bring up clear, yellow, or green mucus from your lungs (sputum). ? Wheezing. ? Having too much mucus in your lungs (chest congestion). ? Having shortness of breath.  A fever.  Chills.  Aches and pains, including: ? Tightness in your chest and other body aches. ? A sore throat. How is this diagnosed? This condition is usually diagnosed based on:  Your symptoms and medical history.  A physical exam. You may also have other tests, including tests to rule out other conditions, such pneumonia. These tests include:  A test of lung function.  Test of a mucus sample to look for the presence of  bacteria.  Tests to check the oxygen level in your blood.  Blood tests.  Chest X-ray. How is this treated? Most cases of acute bronchitis clear up over time without treatment. Your health care provider may recommend:  Drinking more fluids. This can thin your mucus, which may improve your breathing.  Taking a medicine for a fever or cough.  Using a device that gets medicine into your lungs (inhaler) to help improve breathing and control coughing.  Using a vaporizer or a humidifier. These are machines that add water to the air to help you breathe better. Follow these instructions at home: Activity  Get plenty of rest.  Return to your normal activities as told by your health care provider. Ask your health care provider what activities are safe for you. Lifestyle  Drink enough fluid to keep your urine pale yellow.  Do not drink alcohol.  Do not use any products that contain nicotine or tobacco, such as cigarettes, e-cigarettes, and chewing tobacco. If you need help quitting, ask your health care provider. Be aware that: ? Your bronchitis will get worse if you smoke or breathe in other people's smoke (secondhand smoke). ? Your lungs will heal faster if you quit smoking. General instructions   Take over-the-counter and prescription medicines only as told by your health care provider.  Use an inhaler, vaporizer, or humidifier as told by your health care provider.  If you have a sore throat, gargle with a salt-water mixture 3-4 times a day or as needed. To make a salt-water mixture, completely dissolve -1 tsp (3-6   g) of salt in 1 cup (237 mL) of warm water.  Keep all follow-up visits as told by your health care provider. This is important. How is this prevented? To lower your risk of getting this condition again:  Wash your hands often with soap and water. If soap and water are not available, use hand sanitizer.  Avoid contact with people who have cold symptoms.  Try not to  touch your mouth, nose, or eyes with your hands.  Avoid places where there are fumes from chemicals. Breathing these fumes will make your condition worse.  Get the flu shot every year. Contact a health care provider if:  Your symptoms do not improve after 2 weeks of treatment.  You vomit more than once or twice.  You have symptoms of dehydration such as: ? Dark urine. ? Dry skin or eyes. ? Increased thirst. ? Headaches. ? Confusion. ? Muscle cramps. Get help right away if you:  Cough up blood.  Feel pain in your chest.  Have severe shortness of breath.  Faint or keep feeling like you are going to faint.  Have a severe headache.  Have fever or chills that get worse. These symptoms may represent a serious problem that is an emergency. Do not wait to see if the symptoms will go away. Get medical help right away. Call your local emergency services (911 in the [place] Do not drive yourself to the hospital. Summary  Acute bronchitis is sudden (acute) inflammation of the air tubes (bronchi) between the windpipe and the lungs. In adults, acute bronchitis usually goes away within 2 weeks, although coughing may last 3 weeks or longer  Take over-the-counter and prescription medicines only as told by your health care provider.  Drink enough fluid to keep your urine pale yellow.  Contact a health care provider if your symptoms do not improve after 2 weeks of treatment.  Get help right away if you cough up blood, faint, or have chest pain or shortness of breath. This information is not intended to replace advice given to you by your health care provider. Make sure you discuss any questions you have with your health care provider. Document Revised: 04/23/2019 Document Reviewed: 03/02/2019 Elsevier Patient Education  2020 [employer]  

## 2020-05-14 NOTE — Progress Notes (Signed)
Established Patient Office Visit  Subjective:  Patient ID: Miguel Benjamin, male    DOB: [birth year]  Age: 64 y.o. MRN: [id number]  CC: No chief complaint on file.   HPI Miguel Benjamin presents for some mild cough and recent sinus congestion.  He states his girlfriend was recently diagnosed with "pneumonia".  Both he and his girlfriend had Covid testing and these were negative.  He denies any fever.  Has had some thick greenish nasal discharge.  No dyspnea.  No loss of taste or smell.  No nausea, vomiting, or diarrhea.  He has reported COPD.  Former smoker.  No recent hemoptysis.  No obvious wheezing  Past Medical History:  Diagnosis Date  . Allergy   . Arthritis   . COPD (chronic obstructive pulmonary disease) ([facility])     Past Surgical History:  Procedure Laterality Date  . HERNIA REPAIR    . knee reconstruction on right knee    . SHOULDER SURGERY    . TONSILLECTOMY    . TOTAL KNEE ARTHROPLASTY Left 08/01/2017   Procedure: LEFT TOTAL KNEE ARTHROPLASTY;  Surgeon: Ollen Gross, MD;  Location: [facility];  Service: Orthopedics;  Laterality: Left;  Adductor Block    Family History  Problem Relation Age of Onset  . Hyperthyroidism Mother   . Lung cancer Mother   . Cardiomyopathy Father   . Cancer Father        bladder  . Colon cancer Neg Hx   . Esophageal cancer Neg Hx   . Stomach cancer Neg Hx   . Rectal cancer Neg Hx     Social History   Socioeconomic History  . Marital status: Married    Spouse name: Not on file  . Number of children: Not on file  . Years of education: Not on file  . Highest education level: Not on file  Occupational History  . Not on file  Tobacco Use  . Smoking status: Former Smoker    Packs/day: 0.00    Years: 30.00    Pack years: 0.00  . Smokeless tobacco: Never Used  . Tobacco comment: Also use nicotine patch  Vaping Use  . Vaping Use: Some days  Substance and Sexual Activity  . Alcohol use: Yes    Comment: one drink per night    . Drug use: No  . Sexual activity: Yes    Birth control/protection: None  Other Topics Concern  . Not on file  Social History Narrative  . Not on file   Social Determinants of Health   Financial Resource Strain:   . Difficulty of Paying Living Expenses: Not on file  Food Insecurity:   . Worried About Programme researcher, broadcasting/film/video in the Last Year: Not on file  . Ran Out of Food in the Last Year: Not on file  Transportation Needs:   . Lack of Transportation (Medical): Not on file  . Lack of Transportation (Non-Medical): Not on file  Physical Activity:   . Days of Exercise per Week: Not on file  . Minutes of Exercise per Session: Not on file  Stress:   . Feeling of Stress : Not on file  Social Connections:   . Frequency of Communication with Friends and Family: Not on file  . Frequency of Social Gatherings with Friends and Family: Not on file  . Attends Religious Services: Not on file  . Active Member of Clubs or Organizations: Not on file  . Attends Banker Meetings: Not on file  .  Marital Status: Not on file  Intimate Partner Violence:   . Fear of Current or Ex-Partner: Not on file  . Emotionally Abused: Not on file  . Physically Abused: Not on file  . Sexually Abused: Not on file    Outpatient Medications Prior to Visit  Medication Sig Dispense Refill  . Cholecalciferol (VITAMIN D) 2000 units CAPS Take by mouth.    . cyanocobalamin 500 MCG tablet Take 500 mcg by mouth daily.    . Glucosamine-Chondroit-Vit C-Mn (GLUCOSAMINE 1500 COMPLEX PO) Take by mouth.    . Naproxen Sodium (ALEVE PO) Aleve    . Omega-3 Fatty Acids (FISH OIL) 1000 MG CAPS Take by mouth.    . tadalafil (CIALIS) 5 MG tablet Take 1 tablet (5 mg total) by mouth daily as needed for erectile dysfunction. 90 tablet 1   No facility-administered medications prior to visit.    No Known Allergies  ROS Review of Systems  Constitutional: Negative for chills and fever.  Respiratory: Positive for cough.  Negative for shortness of breath.       Objective:    Physical Exam Vitals reviewed.  Constitutional:      Appearance: Normal appearance.  Cardiovascular:     Rate and Rhythm: Normal rate and regular rhythm.  Pulmonary:     Comments: Wheezes.  No rales.  Slightly diminished breath sounds throughout.  No retractions.    BP (!) 118/58 (BP Location: Left Arm, Patient Position: Sitting, Cuff Size: Normal)   Pulse 88   Temp 98 F (36.7 C) (Oral)   Wt 186 lb 8 oz (84.6 kg)   SpO2 97%   BMI 25.29 kg/m  Wt Readings from Last 3 Encounters:  05/14/20 186 lb 8 oz (84.6 kg)  07/06/19 188 lb 3.2 oz (85.4 kg)  07/03/18 182 lb 11.2 oz (82.9 kg)     Health Maintenance Due  Topic Date Due  . COVID-19 Vaccine (1) Never done  . COLONOSCOPY  07/25/2017  . INFLUENZA VACCINE  03/23/2020    There are no preventive care reminders to display for this patient.  Lab Results  Component Value Date   TSH 0.85 06/26/2019   Lab Results  Component Value Date   WBC 7.6 06/26/2019   HGB 15.5 06/26/2019   HCT 46.9 06/26/2019   MCV 94.2 06/26/2019   PLT 239.0 06/26/2019   Lab Results  Component Value Date   NA 139 06/26/2019   K 4.6 06/26/2019   CO2 29 06/26/2019   GLUCOSE 128 (H) 06/26/2019   BUN 27 (H) 06/26/2019   CREATININE 1.08 06/26/2019   BILITOT 0.4 06/26/2019   ALKPHOS 64 06/26/2019   AST 19 06/26/2019   ALT 21 06/26/2019   PROT 6.6 06/26/2019   ALBUMIN 4.3 06/26/2019   CALCIUM 9.5 06/26/2019   ANIONGAP 5 08/03/2017   GFR 69.04 06/26/2019   Lab Results  Component Value Date   CHOL 230 (H) 06/26/2019   Lab Results  Component Value Date   HDL 86.20 06/26/2019   Lab Results  Component Value Date   LDLCALC 133 (H) 06/26/2019   Lab Results  Component Value Date   TRIG 56.0 06/26/2019   Lab Results  Component Value Date   CHOLHDL 3 06/26/2019   Lab Results  Component Value Date   HGBA1C 5.0 07/06/2019      Assessment & Plan:   Cough.  Recent Covid test  negative.  He does have COPD and comes in now with productive cough.  No respiratory distress.  -  Given COPD history, will cover with doxycycline 100 mg twice daily for 7 days -Consider over-the-counter Mucinex and continue plenty of fluids -Follow-up promptly for any fever, increased shortness of breath, or any persistent cough  Meds ordered this encounter  Medications  . doxycycline (VIBRAMYCIN) 100 MG capsule    Sig: Take 1 capsule (100 mg total) by mouth 2 (two) times daily.    Dispense:  14 capsule    Refill:  0    Follow-up: No follow-ups on file.    Evelena Peat, MD

## 2020-06-17 ENCOUNTER — Encounter: Payer: Self-pay | Admitting: Family Medicine

## 2020-06-17 MED ORDER — TADALAFIL 5 MG PO TABS: 5.0000 mg | ORAL_TABLET | Freq: Every day | ORAL | 1 refills | Status: DC | PRN

## 2020-09-09 ENCOUNTER — Encounter: Payer: Self-pay | Admitting: Family Medicine

## 2020-09-09 MED ORDER — TADALAFIL 5 MG PO TABS: 5.0000 mg | ORAL_TABLET | Freq: Every day | ORAL | 1 refills | Status: DC | PRN

## 2020-09-09 NOTE — Telephone Encounter (Signed)
I refilled the Cialis, as requested.  Go ahead and set up complete physical.

## 2020-11-20 ENCOUNTER — Telehealth: Payer: Self-pay | Admitting: Family Medicine

## 2020-11-20 DIAGNOSIS — Z0000 Encounter for general adult medical examination without abnormal findings: Secondary | ICD-10-CM

## 2020-11-20 NOTE — Telephone Encounter (Signed)
Pt call and want to do his cpe labs before he come for his cpe and want a call back to let him know he can do it .

## 2020-11-21 NOTE — Telephone Encounter (Signed)
Labs have been ordered.  Left a detailed message on verified voice mail informing the patient that his labs has been ordered and asking that he call back to set up a lab appointment. Nothing further needed at this time.

## 2020-11-21 NOTE — Telephone Encounter (Signed)
Yes- can order CBC, BMP, Hepatic, TSH, PSA, Lipid

## 2020-11-24 ENCOUNTER — Other Ambulatory Visit (INDEPENDENT_AMBULATORY_CARE_PROVIDER_SITE_OTHER): Payer: BC Managed Care – PPO

## 2020-11-24 ENCOUNTER — Other Ambulatory Visit: Payer: Self-pay

## 2020-11-24 DIAGNOSIS — Z0000 Encounter for general adult medical examination without abnormal findings: Secondary | ICD-10-CM

## 2020-11-24 DIAGNOSIS — Z125 Encounter for screening for malignant neoplasm of prostate: Secondary | ICD-10-CM | POA: Diagnosis not present

## 2020-11-24 LAB — LIPID PANEL
Cholesterol: 210 mg/dL — ABNORMAL HIGH (ref 0–200)
HDL: 102.6 mg/dL (ref >39.00)
LDL Cholesterol: 97 mg/dL (ref 0–99)
NonHDL: 107.65
Total CHOL/HDL Ratio: 2
Triglycerides: 53 mg/dL (ref 0.0–149.0)
VLDL: 10.6 mg/dL (ref 0.0–40.0)

## 2020-11-24 LAB — CBC WITH DIFFERENTIAL/PLATELET
Basophils Absolute: 0 10*3/uL (ref 0.0–0.1)
Basophils Relative: 0.7 % (ref 0.0–3.0)
Eosinophils Absolute: 0.1 10*3/uL (ref 0.0–0.7)
Eosinophils Relative: 1.7 % (ref 0.0–5.0)
HCT: 44 % (ref 39.0–52.0)
Hemoglobin: 14.7 g/dL (ref 13.0–17.0)
Lymphocytes Relative: 19.7 % (ref 12.0–46.0)
Lymphs Abs: 1.1 10*3/uL (ref 0.7–4.0)
MCHC: 33.3 g/dL (ref 30.0–36.0)
MCV: 93.6 fl (ref 78.0–100.0)
Monocytes Absolute: 0.5 10*3/uL (ref 0.1–1.0)
Monocytes Relative: 9.1 % (ref 3.0–12.0)
Neutro Abs: 3.9 10*3/uL (ref 1.4–7.7)
Neutrophils Relative %: 68.8 % (ref 43.0–77.0)
Platelets: 215 10*3/uL (ref 150.0–400.0)
RBC: 4.7 Mil/uL (ref 4.22–5.81)
RDW: 13.5 % (ref 11.5–15.5)
WBC: 5.6 10*3/uL (ref 4.0–10.5)

## 2020-11-24 LAB — BASIC METABOLIC PANEL
BUN: 17 mg/dL (ref 6–23)
CO2: 27 mEq/L (ref 19–32)
Calcium: 9.5 mg/dL (ref 8.4–10.5)
Chloride: 105 mEq/L (ref 96–112)
Creatinine, Ser: 1.04 mg/dL (ref 0.40–1.50)
GFR: 75.9 mL/min (ref >60.00)
Glucose, Bld: 101 mg/dL — ABNORMAL HIGH (ref 70–99)
Potassium: 4.9 mEq/L (ref 3.5–5.1)
Sodium: 141 mEq/L (ref 135–145)

## 2020-11-24 LAB — HEPATIC FUNCTION PANEL
ALT: 21 U/L (ref 0–53)
AST: 26 U/L (ref 0–37)
Albumin: 4.1 g/dL (ref 3.5–5.2)
Alkaline Phosphatase: 74 U/L (ref 39–117)
Bilirubin, Direct: 0.1 mg/dL (ref 0.0–0.3)
Total Bilirubin: 0.4 mg/dL (ref 0.2–1.2)
Total Protein: 5.9 g/dL — ABNORMAL LOW (ref 6.0–8.3)

## 2020-11-24 LAB — TSH: TSH: 1.15 u[IU]/mL (ref 0.35–4.50)

## 2020-11-24 LAB — PSA: PSA: 2.89 ng/mL (ref 0.10–4.00)

## 2020-12-03 ENCOUNTER — Encounter: Payer: Self-pay | Admitting: Family Medicine

## 2020-12-03 ENCOUNTER — Other Ambulatory Visit: Payer: Self-pay

## 2020-12-03 ENCOUNTER — Ambulatory Visit (INDEPENDENT_AMBULATORY_CARE_PROVIDER_SITE_OTHER): Payer: BC Managed Care – PPO | Admitting: Family Medicine

## 2020-12-03 VITALS — BP 124/72 | HR 101 | Temp 98.1°F | Ht 72.0 in | Wt 184.7 lb

## 2020-12-03 DIAGNOSIS — N401 Enlarged prostate with lower urinary tract symptoms: Secondary | ICD-10-CM | POA: Diagnosis not present

## 2020-12-03 DIAGNOSIS — M5412 Radiculopathy, cervical region: Secondary | ICD-10-CM | POA: Diagnosis not present

## 2020-12-03 DIAGNOSIS — R3912 Poor urinary stream: Secondary | ICD-10-CM | POA: Diagnosis not present

## 2020-12-03 DIAGNOSIS — Z0000 Encounter for general adult medical examination without abnormal findings: Secondary | ICD-10-CM | POA: Diagnosis not present

## 2020-12-03 MED ORDER — TAMSULOSIN HCL 0.4 MG PO CAPS: 0.4000 mg | ORAL_CAPSULE | Freq: Every day | ORAL | 3 refills | Status: DC

## 2020-12-03 NOTE — Patient Instructions (Signed)
Lung Cancer Screening A lung cancer screening is a test that checks for lung cancer. Lung cancer screening is done to look for lung cancer in its very early stages when you are not likely to have any symptoms and before it spreads beyond the lung, making it harder to treat. Finding cancer early improves the chances of successful treatment. It may save your life. Who should have screening? You should be screened for lung cancer if all of these apply:  You currently smoke or you have quit smoking within the past 15 years.  You are 50-80 years old. Screening may be recommended up to age 80 depending on your overall health and other factors.  You are in good general health.  You have a smoking history of 1 pack of cigarettes a day for 20 years or 2 packs a day for 10 years. Screening may also be recommended if you are at high risk for the disease. You may be at high risk if:  You have a family history of lung cancer.  You have been exposed to asbestos or radon.  You have chronic obstructive pulmonary disease (COPD). How is screening done? The recommended screening test is a low-dose computed tomography (LDCT) scan. This scan takes detailed images of the lungs. This allows a health care provider to look for abnormal cells. If you are at risk for lung cancer, it is recommended that you get screened once a year. Talk to your health care provider about the risks, benefits, and limitations of screening.   What are the benefits of screening? Screening can find lung cancer early, before symptoms start and before it has spread outside of the lungs. The chances of curing lung cancer are greater if the cancer is diagnosed early. What are the risks of screening?  The screening may show lung cancer when no cancer is present (false-positive result).  The screening may not find lung cancer when it is present.  The person gets exposed to radiation. How can I lower my risk of lung cancer? Make these  lifestyle changes to lower your risk of developing lung cancer:  Do not use any products that contain nicotine or tobacco, such as cigarettes, e-cigarettes, and chewing tobacco. If you need help quitting, ask your health care provider.  Avoid secondhand smoke.  Avoid exposure to radiation.  Avoid exposure to radon gas. Have your home checked for radon regularly.  Avoid things that cause cancer (carcinogens).  Avoid living or working in places with high air pollution. Questions to ask your health care provider  Am I eligible for lung cancer screening?  Does my health insurance cover the cost of lung cancer screening?  What happens if the lung cancer screening shows something of concern?  How soon will I have results from my lung cancer screening?  Is there anything that I need to do to prepare for my lung cancer screening?  What happens if I decide not to have lung cancer screening? Where to find more information Ask your health care provider about the risks and benefits of screening. More information and resources are available from these organizations:  American Cancer Society (ACS): www.cancer.org  American Lung Association: www.lung.org Contact a health care provider if:  You start to show symptoms of lung cancer, including: ? Coughing that will not go away. ? Making whistling sounds when you breathe (wheezing). ? Chest pain. ? Coughing up blood. ? Shortness of breath. ? Weight loss that cannot be explained. ? Constant tiredness (fatigue). ?   Hoarse voice. Summary  Lung cancer screening may find lung cancer before symptoms appear. Finding cancer early improves the chances of successful treatment. It may save your life.  The recommended screening test is a low-dose computed tomography (LDCT) scan that looks for abnormal cells in the lungs. If you are at risk for lung cancer, it is recommended that you get screened once a year.  You can make lifestyle changes to lower  your risk of lung cancer.  Ask your health care provider about the risks and benefits of screening. This information is not intended to replace advice given to you by your health care provider. Make sure you discuss any questions you have with your health care provider. Document Revised: 12/31/2019 Document Reviewed: 08/07/2019 Elsevier Patient Education  2021 Elsevier Inc.  

## 2020-12-03 NOTE — Progress Notes (Signed)
Established Patient Office Visit  Subjective:  Patient ID: Miguel Benjamin, male    DOB: 1955-12-19  Age: 65 y.o. MRN: [id number]  CC:  Chief Complaint  Patient presents with  . Annual Exam    No new concerns    HPI Miguel Benjamin presents for physical exam-and for additional medical problems as below.  He has history of osteoarthritis most involving the knees.  Previous nicotine use.  Quit 4 years ago.  History of COPD.  Generally doing well.  He went through separation and divorce couple years ago.  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  Business is going very well.  Has had some recent left upper extremity dull achy-like pains which are intermittent.  No significant neck pain.  Not impairing tennis or golf.  Pain is relatively mild.  No weakness.  No significant numbness.  Does relate some nocturia and slow stream.  He thinks he may not be fully emptying his bladder especially at night.  Does not take any anticholinergic medications.  Already takes Cialis 5 mg daily  Health maintenance reviewed  -He has completed Covid vaccines.  We do not have dates. -Overdue for repeat colonoscopy -Tetanus due 2028 -Has had prior Shingrix  Social history-divorced couple years ago after 30-year marriage.  He has 2 children.  No grandchildren.  Quit smoking 4 years ago.  Approximately 30-pack-year history.  Drinks usually 2-3 alcoholic beverages per day.  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  He plays tennis usually few times a week and also golfs.  Family history-mother had hyperthyroidism.  Father reportedly had lung cancer and bladder cancer.  He was a smoker.  Sister with some type of brain cancer.  This was operable and she is still alive.  He is not sure which type.   Past Medical History:  Diagnosis Date  . Allergy   . Arthritis   . COPD (chronic  obstructive pulmonary disease) (HCC)     Past Surgical History:  Procedure Laterality Date  . HERNIA REPAIR    . knee reconstruction on right knee    . SHOULDER SURGERY    . TONSILLECTOMY    . TOTAL KNEE ARTHROPLASTY Left 08/01/2017   Procedure: LEFT TOTAL KNEE ARTHROPLASTY;  Surgeon: Ollen Gross, MD;  Location: [facility];  Service: Orthopedics;  Laterality: Left;  Adductor Block    Family History  Problem Relation Age of Onset  . Hyperthyroidism Mother   . Cardiomyopathy Father   . Cancer Father        bladder  . Lung cancer Father   . Cancer Sister        "brain cancer"  . Colon cancer Neg Hx   . Esophageal cancer Neg Hx   . Stomach cancer Neg Hx   . Rectal cancer Neg Hx     Social History   Socioeconomic History  . Marital status: Married    Spouse name: Not on file  . Number of children: Not on file  . Years of education: Not on file  . Highest education level: Not on file  Occupational History  . Not on file  Tobacco Use  . Smoking status: Former Smoker    Packs/day: 0.00    Years: 30.00    Pack years: 0.00  . Smokeless tobacco: Never Used  . Tobacco comment: Also use nicotine patch  Vaping Use  . Vaping Use: Some days  Substance and Sexual Activity  . Alcohol use: Yes    Comment: one drink per night  . Drug use: No  . Sexual activity: Yes    Birth control/protection: None  Other Topics Concern  . Not on file  Social History Narrative  . Not on file   Social Determinants of Health   Financial Resource Strain: Not on file  Food Insecurity: Not on file  Transportation Needs: Not on file  Physical Activity: Not on file  Stress: Not on file  Social Connections: Not on file  Intimate Partner Violence: Not on file    Outpatient Medications Prior to Visit  Medication Sig Dispense Refill  . Cholecalciferol (VITAMIN D) 2000 units CAPS Take by mouth.    . cyanocobalamin 500 MCG tablet Take 500 mcg by mouth daily.    Marland Kitchen doxycycline (VIBRAMYCIN) 100 MG  capsule Take 1 capsule (100 mg total) by mouth 2 (two) times daily. 14 capsule 0  . Glucosamine-Chondroit-Vit C-Mn (GLUCOSAMINE 1500 COMPLEX PO) Take by mouth.    . Naproxen Sodium (ALEVE PO) Aleve    . Omega-3 Fatty Acids (FISH OIL) 1000 MG CAPS Take by mouth.    . tadalafil (CIALIS) 5 MG tablet Take 1 tablet (5 mg total) by mouth daily as needed for erectile dysfunction. 90 tablet 1   No facility-administered medications prior to visit.    No Known Allergies  ROS Review of Systems  Constitutional: Negative for activity change, appetite change, fatigue and fever.  HENT: Negative for congestion, ear pain and trouble swallowing.   Eyes: Negative for pain and visual disturbance.  Respiratory: Negative for cough, shortness of breath and wheezing.   Cardiovascular: Negative for chest pain and palpitations.  Gastrointestinal: Negative for abdominal distention, abdominal pain, blood in stool, constipation, diarrhea, nausea, rectal pain and vomiting.  Genitourinary: Positive for difficulty urinating. Negative for frequency, hematuria and testicular pain.  Musculoskeletal: Negative for arthralgias and joint swelling.  Skin: Negative for rash.  Neurological: Negative for dizziness, syncope and headaches.  Hematological: Negative for adenopathy.  Psychiatric/Behavioral: Negative for confusion and dysphoric mood.      Objective:    Physical Exam Constitutional:      General: He is not in acute distress.    Appearance: He is well-developed.  HENT:     Head: Normocephalic and atraumatic.     Right Ear: External ear normal.     Left Ear: External ear normal.  Eyes:     Conjunctiva/sclera: Conjunctivae normal.     Pupils: Pupils are equal, round, and reactive to light.  Neck:     Thyroid: No thyromegaly.  Cardiovascular:     Rate and Rhythm: Normal rate and regular rhythm.     Heart sounds: Normal heart sounds. No murmur heard.   Pulmonary:     Effort: No respiratory distress.      Breath sounds: No wheezing or rales.  Abdominal:     General: Bowel sounds are normal. There is no distension.     Palpations: Abdomen is soft. There is no mass.     Tenderness: There is no abdominal tenderness. There is no guarding or rebound.  Genitourinary:    Comments: Prostate is slightly enlarged but nontender.  No nodules.  No rectal masses. Musculoskeletal:     Cervical back: Normal range of motion and neck supple.     Comments: Full range of motion left shoulder.  No point tenderness.  Lymphadenopathy:  Cervical: No cervical adenopathy.  Skin:    Findings: No rash.  Neurological:     Mental Status: He is alert and oriented to person, place, and time.     Cranial Nerves: No cranial nerve deficit.     Deep Tendon Reflexes: Reflexes normal.     Comments: Full strength upper extremities with symmetric reflexes.     BP 124/72 (BP Location: Left Arm, Patient Position: Sitting, Cuff Size: Normal)   Pulse (!) 101   Temp 98.1 F (36.7 C) (Oral)   Ht 6' (1.829 m)   Wt 184 lb 11.2 oz (83.8 kg)   SpO2 96%   BMI 25.05 kg/m  Wt Readings from Last 3 Encounters:  12/03/20 184 lb 11.2 oz (83.8 kg)  05/14/20 186 lb 8 oz (84.6 kg)  07/06/19 188 lb 3.2 oz (85.4 kg)     Health Maintenance Due  Topic Date Due  . COVID-19 Vaccine (1) Never done  . COLONOSCOPY (Pts [id number] Insurance coverage will need to be confirmed)  07/25/2017    There are no preventive care reminders to display for this patient.  Lab Results  Component Value Date   TSH 1.15 11/24/2020   Lab Results  Component Value Date   WBC 5.6 11/24/2020   HGB 14.7 11/24/2020   HCT 44.0 11/24/2020   MCV 93.6 11/24/2020   PLT 215.0 11/24/2020   Lab Results  Component Value Date   NA 141 11/24/2020   K 4.9 11/24/2020   CO2 27 11/24/2020   GLUCOSE 101 (H) 11/24/2020   BUN 17 11/24/2020   CREATININE 1.04 11/24/2020   BILITOT 0.4 11/24/2020   ALKPHOS 74 11/24/2020   AST 26 11/24/2020   ALT 21 11/24/2020    PROT 5.9 (L) 11/24/2020   ALBUMIN 4.1 11/24/2020   CALCIUM 9.5 11/24/2020   ANIONGAP 5 08/03/2017   GFR 75.90 11/24/2020   Lab Results  Component Value Date   CHOL 210 (H) 11/24/2020   Lab Results  Component Value Date   HDL 102.60 11/24/2020   Lab Results  Component Value Date   LDLCALC 97 11/24/2020   Lab Results  Component Value Date   TRIG 53.0 11/24/2020   Lab Results  Component Value Date   CHOLHDL 2 11/24/2020   Lab Results  Component Value Date   HGBA1C 5.0 07/06/2019      Assessment & Plan:   #1 physical exam.  Recent labs reviewed with patient with no significant abnormalities.  He had HDL cholesterol 102.  We discussed the following health maintenance issues  -Schedule repeat colonoscopy -We discussed low-dose CT lung cancer screening.  He does qualify in terms of age, 30-pack-year history, quit 4 years ago, no active chest symptoms.  He is undecided at this point but was given information to read and will let us know if interested -Continue regular exercise habits- -needs pneumonia vaccine by next year -Continue with annual flu vaccine  #2 probable BPH.  He has occasional nocturia and slow stream.  Recent PSA stable and actually improved compared to last year -Avoid anticholinergic medications -Discussed trial of Flomax 0.4 mg nightly and give us some feedback in the next month  #3 left upper extremity pain.  Suspect cervical radiculitis.  Nonfocal neuro exam -Follow-up for any progressive pain or any neurologic symptoms such as numbness or weakness. -Consider cervical imaging starting with plain films for any persistent or progressive symptoms.   Meds ordered this encounter  Medications  . tamsulosin (FLOMAX) 0.4 MG CAPS  capsule    Sig: Take 1 capsule (0.4 mg total) by mouth daily.    Dispense:  30 capsule    Refill:  3    Follow-up: No follow-ups on file.    Evelena Peat, MD

## 2021-01-09 ENCOUNTER — Encounter: Payer: Self-pay | Admitting: Family Medicine

## 2021-08-07 ENCOUNTER — Telehealth: Payer: Self-pay

## 2021-08-07 NOTE — Telephone Encounter (Signed)
Cough for 3 weeks, chest congestion  Patient has been scheduled for a virtual visit on Monday with Dr. [place].

## 2021-08-07 NOTE — Telephone Encounter (Signed)
Patient called asking for an Rx patient stated he has had a cough for a couple of weeks and it has moved up into his chest he is hacking up mucus and mucinex is not touching it.

## 2021-08-10 ENCOUNTER — Telehealth (INDEPENDENT_AMBULATORY_CARE_PROVIDER_SITE_OTHER): Payer: BC Managed Care – PPO | Admitting: Family Medicine

## 2021-08-10 DIAGNOSIS — J019 Acute sinusitis, unspecified: Secondary | ICD-10-CM

## 2021-08-10 DIAGNOSIS — R059 Cough, unspecified: Secondary | ICD-10-CM

## 2021-08-10 MED ORDER — AMOXICILLIN-POT CLAVULANATE 875-125 MG PO TABS: 1.0000 | ORAL_TABLET | Freq: Two times a day (BID) | ORAL | 0 refills | Status: DC

## 2021-08-10 NOTE — Progress Notes (Signed)
Patient ID: Miguel Benjamin, male   DOB: 19-Mar-1956, 65 y.o.   MRN: [id number]   This visit type was conducted due to national recommendations for restrictions regarding the COVID-19 pandemic in an effort to limit this patient's exposure and mitigate transmission in our community.   Virtual Visit via Video Note  I connected with Miguel Benjamin on 08/10/21 at 10:00 AM EST by a video enabled telemedicine application and verified that I am speaking with the correct person using two identifiers.  Location patient: home Location provider:work or home office Persons participating in the virtual visit: patient, provider  I discussed the limitations of evaluation and management by telemedicine and the availability of in person appointments. The patient expressed understanding and agreed to proceed.   HPI:  Miguel Benjamin relates about 3-week history of dry cough.  Around last Wednesday his cough seemed to become more productive.  No fever.  He has had some pressure behind the eyes along with intermittent headache and sinus pressure.  COVID testing negative at home.  He feels he may have more congestion in his lungs on the right side.  No dyspnea.  No obvious wheezing.  He is an ex-smoker.  No reported hemoptysis.   ROS: See pertinent positives and negatives per HPI.  Past Medical History:  Diagnosis Date   Allergy    Arthritis    COPD (chronic obstructive pulmonary disease) (HCC)     Past Surgical History:  Procedure Laterality Date   HERNIA REPAIR     knee reconstruction on right knee     SHOULDER SURGERY     TONSILLECTOMY     TOTAL KNEE ARTHROPLASTY Left 08/01/2017   Procedure: LEFT TOTAL KNEE ARTHROPLASTY;  Surgeon: Ollen Gross, MD;  Location: [facility];  Service: Orthopedics;  Laterality: Left;  Adductor Block    Family History  Problem Relation Age of Onset   Hyperthyroidism Mother    Cardiomyopathy Father    Cancer Father        bladder   Lung cancer Father    Cancer Sister         "brain cancer"   Colon cancer Neg Hx    Esophageal cancer Neg Hx    Stomach cancer Neg Hx    Rectal cancer Neg Hx     SOCIAL HX: Former smoker.   Current Outpatient Medications:    amoxicillin-clavulanate (AUGMENTIN) 875-125 MG tablet, Take 1 tablet by mouth 2 (two) times daily., Disp: 20 tablet, Rfl: 0   Cholecalciferol (VITAMIN D) 2000 units CAPS, Take by mouth., Disp: , Rfl:    cyanocobalamin 500 MCG tablet, Take 500 mcg by mouth daily., Disp: , Rfl:    Glucosamine-Chondroit-Vit C-Mn (GLUCOSAMINE 1500 COMPLEX PO), Take by mouth., Disp: , Rfl:    Naproxen Sodium (ALEVE PO), Aleve, Disp: , Rfl:    Omega-3 Fatty Acids (FISH OIL) 1000 MG CAPS, Take by mouth., Disp: , Rfl:    tadalafil (CIALIS) 5 MG tablet, Take 1 tablet (5 mg total) by mouth daily as needed for erectile dysfunction., Disp: 90 tablet, Rfl: 1   tamsulosin (FLOMAX) 0.4 MG CAPS capsule, Take 1 capsule (0.4 mg total) by mouth daily., Disp: 30 capsule, Rfl: 3  EXAM:  VITALS per patient if applicable:  GENERAL: alert, oriented, appears well and in no acute distress  HEENT: atraumatic, conjunttiva clear, no obvious abnormalities on inspection of external nose and ears  NECK: normal movements of the head and neck  LUNGS: on inspection no signs of respiratory distress, breathing rate  appears normal, no obvious gross SOB, gasping or wheezing  CV: no obvious cyanosis  MS: moves all visible extremities without noticeable abnormality  PSYCH/NEURO: pleasant and cooperative, no obvious depression or anxiety, speech and thought processing grossly intact  ASSESSMENT AND PLAN:  Discussed the following assessment and plan:  Persistent cough.  He also is describing some likely sinusitis symptoms following recent URI several weeks ago.  -Recommend Augmentin 875 mg by mouth with food twice daily for 10 days given duration of symptoms -If cough not improving with the above consider PA and lateral chest x-ray     I discussed  the assessment and treatment plan with the patient. The patient was provided an opportunity to ask questions and all were answered. The patient agreed with the plan and demonstrated an understanding of the instructions.   The patient was advised to call back or seek an in-person evaluation if the symptoms worsen or if the condition fails to improve as anticipated.     Evelena Peat, MD

## 2021-08-25 ENCOUNTER — Other Ambulatory Visit (HOSPITAL_COMMUNITY)
Admission: RE | Admit: 2021-08-25 | Discharge: 2021-08-25 | Disposition: A | Discharge status: Home / Self Care | Payer: BC Managed Care – PPO | Source: Ambulatory Visit | Attending: Family Medicine | Admitting: Family Medicine

## 2021-08-25 ENCOUNTER — Telehealth: Payer: Self-pay | Admitting: Family Medicine

## 2021-08-25 ENCOUNTER — Ambulatory Visit (INDEPENDENT_AMBULATORY_CARE_PROVIDER_SITE_OTHER): Payer: BC Managed Care – PPO | Admitting: Family Medicine

## 2021-08-25 ENCOUNTER — Encounter: Payer: Self-pay | Admitting: Family Medicine

## 2021-08-25 VITALS — BP 140/80 | HR 96 | Temp 98.1°F | Wt 181.3 lb

## 2021-08-25 DIAGNOSIS — Z113 Encounter for screening for infections with a predominantly sexual mode of transmission: Secondary | ICD-10-CM | POA: Insufficient documentation

## 2021-08-25 NOTE — Telephone Encounter (Signed)
See mychart messages

## 2021-08-25 NOTE — Progress Notes (Signed)
Established Patient Office Visit  Subjective:  Patient ID: Miguel Benjamin, male    DOB: [birth year]  Age: 66 y.o. MRN: [id number]  CC:  Chief Complaint  Patient presents with   Follow-up    HPI Miguel Benjamin presents for the following items  Recent respiratory illness.  Still has some residual cough but feels like he is gradually improving.  No hemoptysis.  No dyspnea.  No fever.  He relates December 23 he noticed small erythematous area on the dorsum of the glans of the penis.  Nonvesicular.  Nonpainful.  No penile discharge.  He had same steady girlfriend for about a year but she recently moved to [facility].  No history of STD.  He did have encounter with new partner December 29 and she performed oral sex but had no intercourse whatsoever.  He noted a little bit of mild burning with urination a day or 2 later.  He took some leftover erythromycin.  Denies any fevers or chills.  No penile discharge.  Past Medical History:  Diagnosis Date   Allergy    Arthritis    COPD (chronic obstructive pulmonary disease) ([place])     Past Surgical History:  Procedure Laterality Date   HERNIA REPAIR     knee reconstruction on right knee     SHOULDER SURGERY     TONSILLECTOMY     TOTAL KNEE ARTHROPLASTY Left 08/01/2017   Procedure: LEFT TOTAL KNEE ARTHROPLASTY;  Surgeon: Gaynelle Arabian, MD;  Location: [facility];  Service: Orthopedics;  Laterality: Left;  Adductor Block    Family History  Problem Relation Age of Onset   Hyperthyroidism Mother    Cardiomyopathy Father    Cancer Father        bladder   Lung cancer Father    Cancer Sister        "brain cancer"   Colon cancer Neg Hx    Esophageal cancer Neg Hx    Stomach cancer Neg Hx    Rectal cancer Neg Hx     Social History   Socioeconomic History   Marital status: Married    Spouse name: Not on file   Number of children: Not on file   Years of education: Not on file   Highest education level: Not on file  Occupational  History   Not on file  Tobacco Use   Smoking status: Former    Packs/day: 0.00    Years: 30.00    Pack years: 0.00    Types: Cigarettes   Smokeless tobacco: Never   Tobacco comments:    Also use nicotine patch  Vaping Use   Vaping Use: Some days  Substance and Sexual Activity   Alcohol use: Yes    Comment: one drink per night   Drug use: No   Sexual activity: Yes    Birth control/protection: None  Other Topics Concern   Not on file  Social History Narrative   Not on file   Social Determinants of Health   Financial Resource Strain: Not on file  Food Insecurity: Not on file  Transportation Needs: Not on file  Physical Activity: Not on file  Stress: Not on file  Social Connections: Not on file  Intimate Partner Violence: Not on file    Outpatient Medications Prior to Visit  Medication Sig Dispense Refill   amoxicillin-clavulanate (AUGMENTIN) 875-125 MG tablet Take 1 tablet by mouth 2 (two) times daily. 20 tablet 0   Cholecalciferol (VITAMIN D) 2000 units CAPS Take by  mouth.     cyanocobalamin 500 MCG tablet Take 500 mcg by mouth daily.     Glucosamine-Chondroit-Vit C-Mn (GLUCOSAMINE 1500 COMPLEX PO) Take by mouth.     Naproxen Sodium (ALEVE PO) Aleve     Omega-3 Fatty Acids (FISH OIL) 1000 MG CAPS Take by mouth.     tadalafil (CIALIS) 5 MG tablet Take 1 tablet (5 mg total) by mouth daily as needed for erectile dysfunction. 90 tablet 1   tamsulosin (FLOMAX) 0.4 MG CAPS capsule Take 1 capsule (0.4 mg total) by mouth daily. 30 capsule 3   No facility-administered medications prior to visit.    No Known Allergies  ROS Review of Systems  Constitutional:  Negative for chills and fever.  Respiratory:  Positive for cough. Negative for shortness of breath and wheezing.   Genitourinary:  Negative for difficulty urinating, hematuria, penile discharge, penile pain and urgency.     Objective:    Physical Exam Vitals reviewed.  Constitutional:      Appearance: Normal  appearance.  Cardiovascular:     Rate and Rhythm: Normal rate and regular rhythm.  Pulmonary:     Effort: Pulmonary effort is normal.     Breath sounds: Normal breath sounds. No wheezing or rales.  Skin:    Comments: Dorsum of the glans of the penis he has approximately 2 x 2 millimeter slightly pinkish nonvesicular nonscaly nonulcerative area  Neurological:     Mental Status: He is alert.    BP 140/80 (BP Location: Left Arm, Patient Position: Sitting, Cuff Size: Normal)    Pulse 96    Temp 98.1 F (36.7 C) (Oral)    Wt 181 lb 4.8 oz (82.2 kg)    SpO2 97%    BMI 24.59 kg/m  Wt Readings from Last 3 Encounters:  08/25/21 181 lb 4.8 oz (82.2 kg)  12/03/20 184 lb 11.2 oz (83.8 kg)  05/14/20 186 lb 8 oz (84.6 kg)     Health Maintenance Due  Topic Date Due   COVID-19 Vaccine (1) Never done   Pneumonia Vaccine 42+ Years old (1 - PCV) Never done   COLONOSCOPY (Pts 45-69yrs Insurance coverage will need to be confirmed)  07/25/2017   INFLUENZA VACCINE  03/23/2021    There are no preventive care reminders to display for this patient.  Lab Results  Component Value Date   TSH 1.15 11/24/2020   Lab Results  Component Value Date   WBC 5.6 11/24/2020   HGB 14.7 11/24/2020   HCT 44.0 11/24/2020   MCV 93.6 11/24/2020   PLT 215.0 11/24/2020   Lab Results  Component Value Date   NA 141 11/24/2020   K 4.9 11/24/2020   CO2 27 11/24/2020   GLUCOSE 101 (H) 11/24/2020   BUN 17 11/24/2020   CREATININE 1.04 11/24/2020   BILITOT 0.4 11/24/2020   ALKPHOS 74 11/24/2020   AST 26 11/24/2020   ALT 21 11/24/2020   PROT 5.9 (L) 11/24/2020   ALBUMIN 4.1 11/24/2020   CALCIUM 9.5 11/24/2020   ANIONGAP 5 08/03/2017   GFR 75.90 11/24/2020   Lab Results  Component Value Date   CHOL 210 (H) 11/24/2020   Lab Results  Component Value Date   HDL 102.60 11/24/2020   Lab Results  Component Value Date   LDLCALC 97 11/24/2020   Lab Results  Component Value Date   TRIG 53.0 11/24/2020    Lab Results  Component Value Date   CHOLHDL 2 11/24/2020   Lab Results  Component Value  Date   HGBA1C 5.0 07/06/2019      Assessment & Plan:   Problem List Items Addressed This Visit   None Visit Diagnoses     Screen for STD (sexually transmitted disease)    -  Primary   Relevant Orders   HIV antibody (with reflex)   RPR   Urine cytology ancillary only     Patient has nonspecific small area of erythema dorsum of the glans which is nonvesicular.  Etiology unclear.  This does not appear to be typical of any STD.    -We did recommend STD screening as above with HIV, RPR, urine cytology for chlamydia and GC -Follow-up for any new rash or progressive rash  No orders of the defined types were placed in this encounter.   Follow-up: No follow-ups on file.    Carolann Littler, MD

## 2021-08-25 NOTE — Telephone Encounter (Signed)
Patient wanted a callback from CMA or Dr.Burchette because he still has questions from the appointment.     Please advise

## 2021-08-26 LAB — URINE CYTOLOGY ANCILLARY ONLY
Chlamydia: NEGATIVE
Comment: NEGATIVE
Comment: NORMAL
Neisseria Gonorrhea: NEGATIVE

## 2021-08-26 LAB — HIV ANTIBODY (ROUTINE TESTING W REFLEX): HIV 1&2 Ab, 4th Generation: NONREACTIVE

## 2021-08-26 LAB — RPR: RPR Ser Ql: NONREACTIVE

## 2021-09-09 ENCOUNTER — Encounter: Payer: Self-pay | Admitting: Family Medicine

## 2021-09-11 ENCOUNTER — Other Ambulatory Visit: Payer: Self-pay

## 2021-09-11 MED ORDER — TRIAMCINOLONE ACETONIDE 0.1 % EX CREA: TOPICAL_CREAM | CUTANEOUS | 0 refills | Status: AC

## 2021-09-28 ENCOUNTER — Ambulatory Visit (INDEPENDENT_AMBULATORY_CARE_PROVIDER_SITE_OTHER): Payer: BC Managed Care – PPO | Admitting: Family Medicine

## 2021-09-28 ENCOUNTER — Encounter: Payer: Self-pay | Admitting: Family Medicine

## 2021-09-28 ENCOUNTER — Other Ambulatory Visit (HOSPITAL_COMMUNITY)
Admission: RE | Admit: 2021-09-28 | Discharge: 2021-09-28 | Disposition: A | Discharge status: Home / Self Care | Payer: BC Managed Care – PPO | Source: Ambulatory Visit | Attending: Family Medicine | Admitting: Family Medicine

## 2021-09-28 VITALS — BP 120/70 | HR 100 | Temp 97.8°F | Wt 185.9 lb

## 2021-09-28 DIAGNOSIS — Z113 Encounter for screening for infections with a predominantly sexual mode of transmission: Secondary | ICD-10-CM | POA: Diagnosis not present

## 2021-09-28 DIAGNOSIS — N4889 Other specified disorders of penis: Secondary | ICD-10-CM | POA: Diagnosis not present

## 2021-09-28 DIAGNOSIS — L821 Other seborrheic keratosis: Secondary | ICD-10-CM

## 2021-09-28 LAB — URINALYSIS
Bilirubin Urine: NEGATIVE
Hgb urine dipstick: NEGATIVE
Ketones, ur: NEGATIVE
Leukocytes,Ua: NEGATIVE
Nitrite: NEGATIVE
Specific Gravity, Urine: >=1.03 — AB (ref 1.000–1.030)
Urine Glucose: NEGATIVE
Urobilinogen, UA: 0.2 (ref 0.0–1.0)
pH: 5.5 (ref 5.0–8.0)

## 2021-09-28 NOTE — Progress Notes (Signed)
Established Patient Office Visit  Subjective:  Patient ID: Miguel Benjamin, male    DOB: 29-Jun-1956  Age: 66 y.o. MRN: [id number]  CC:  Chief Complaint  Patient presents with   skin check    Right hip, change in size, skin feels rougher    HPI Miguel Benjamin presents for the following items  He has somewhat elongated well-demarcated scaly lesion right lateral hip.  Slowly growing in size.  Nonpainful.  No prior history of any skin cancers.  Patient would like to get repeat STD testing.  Recently had very small area of penile rash which is confined to the glans.  This is resolving with topical hydrocortisone.  Occasional recent mild discomfort near the tip of the urethra.  No discharge.  He had recent HIV, RPR, and urine cytologies for GC and chlamydia which were all negative.  He did have an encounter with another individual over a month ago and even though screening last month was negative he would like to be rescreened.  He does have a steady girlfriend currently.  Does not use barrier protection when having intercourse with her.  Past Medical History:  Diagnosis Date   Allergy    Arthritis    COPD (chronic obstructive pulmonary disease) ([place])     Past Surgical History:  Procedure Laterality Date   HERNIA REPAIR     knee reconstruction on right knee     SHOULDER SURGERY     TONSILLECTOMY     TOTAL KNEE ARTHROPLASTY Left 08/01/2017   Procedure: LEFT TOTAL KNEE ARTHROPLASTY;  Surgeon: Gaynelle Arabian, MD;  Location: [facility];  Service: Orthopedics;  Laterality: Left;  Adductor Block    Family History  Problem Relation Age of Onset   Hyperthyroidism Mother    Cardiomyopathy Father    Cancer Father        bladder   Lung cancer Father    Cancer Sister        "brain cancer"   Colon cancer Neg Hx    Esophageal cancer Neg Hx    Stomach cancer Neg Hx    Rectal cancer Neg Hx     Social History   Socioeconomic History   Marital status: Married    Spouse name: Not  on file   Number of children: Not on file   Years of education: Not on file   Highest education level: Not on file  Occupational History   Not on file  Tobacco Use   Smoking status: Former    Packs/day: 0.00    Years: 30.00    Pack years: 0.00    Types: Cigarettes   Smokeless tobacco: Never   Tobacco comments:    Also use nicotine patch  Vaping Use   Vaping Use: Some days  Substance and Sexual Activity   Alcohol use: Yes    Comment: one drink per night   Drug use: No   Sexual activity: Yes    Birth control/protection: None  Other Topics Concern   Not on file  Social History Narrative   Not on file   Social Determinants of Health   Financial Resource Strain: Not on file  Food Insecurity: Not on file  Transportation Needs: Not on file  Physical Activity: Not on file  Stress: Not on file  Social Connections: Not on file  Intimate Partner Violence: Not on file    Outpatient Medications Prior to Visit  Medication Sig Dispense Refill   amoxicillin-clavulanate (AUGMENTIN) 875-125 MG tablet Take 1  tablet by mouth 2 (two) times daily. 20 tablet 0   Cholecalciferol (VITAMIN D) 2000 units CAPS Take by mouth.     cyanocobalamin 500 MCG tablet Take 500 mcg by mouth daily.     Glucosamine-Chondroit-Vit C-Mn (GLUCOSAMINE 1500 COMPLEX PO) Take by mouth.     Naproxen Sodium (ALEVE PO) Aleve     Omega-3 Fatty Acids (FISH OIL) 1000 MG CAPS Take by mouth.     tadalafil (CIALIS) 5 MG tablet Take 1 tablet (5 mg total) by mouth daily as needed for erectile dysfunction. 90 tablet 1   tamsulosin (FLOMAX) 0.4 MG CAPS capsule Take 1 capsule (0.4 mg total) by mouth daily. 30 capsule 3   triamcinolone cream (KENALOG) 0.1 % Apply to area once daily for couple of weeks. 15 g 0   No facility-administered medications prior to visit.    No Known Allergies  ROS Review of Systems  Genitourinary:  Negative for decreased urine volume, difficulty urinating, genital sores, hematuria, penile  discharge and urgency.  Skin:  Negative for rash.     Objective:    Physical Exam Vitals reviewed.  Constitutional:      Appearance: Normal appearance.  Cardiovascular:     Rate and Rhythm: Normal rate and regular rhythm.  Genitourinary:    Comments: Glands, shaft of penis, and urethral orifice all appear normal. Skin:    Comments: Demarcated somewhat oval-shaped scaly lesion right lateral hip.  Approximately 1 cm in length and half centimeter in diameter.  No atypical features  Neurological:     Mental Status: He is alert.    BP 120/70 (BP Location: Left Arm, Patient Position: Sitting, Cuff Size: Normal)    Pulse 100    Temp 97.8 F (36.6 C) (Oral)    Wt 185 lb 14.4 oz (84.3 kg)    SpO2 99%    BMI 25.21 kg/m  Wt Readings from Last 3 Encounters:  09/28/21 185 lb 14.4 oz (84.3 kg)  08/25/21 181 lb 4.8 oz (82.2 kg)  12/03/20 184 lb 11.2 oz (83.8 kg)     Health Maintenance Due  Topic Date Due   COVID-19 Vaccine (1) Never done   Pneumonia Vaccine 2+ Years old (1 - PCV) Never done   COLONOSCOPY (Pts 45-52yrs Insurance coverage will need to be confirmed)  07/25/2017   INFLUENZA VACCINE  03/23/2021    There are no preventive care reminders to display for this patient.  Lab Results  Component Value Date   TSH 1.15 11/24/2020   Lab Results  Component Value Date   WBC 5.6 11/24/2020   HGB 14.7 11/24/2020   HCT 44.0 11/24/2020   MCV 93.6 11/24/2020   PLT 215.0 11/24/2020   Lab Results  Component Value Date   NA 141 11/24/2020   K 4.9 11/24/2020   CO2 27 11/24/2020   GLUCOSE 101 (H) 11/24/2020   BUN 17 11/24/2020   CREATININE 1.04 11/24/2020   BILITOT 0.4 11/24/2020   ALKPHOS 74 11/24/2020   AST 26 11/24/2020   ALT 21 11/24/2020   PROT 5.9 (L) 11/24/2020   ALBUMIN 4.1 11/24/2020   CALCIUM 9.5 11/24/2020   ANIONGAP 5 08/03/2017   GFR 75.90 11/24/2020   Lab Results  Component Value Date   CHOL 210 (H) 11/24/2020   Lab Results  Component Value Date   HDL  102.60 11/24/2020   Lab Results  Component Value Date   LDLCALC 97 11/24/2020   Lab Results  Component Value Date   TRIG 53.0 11/24/2020  Lab Results  Component Value Date   CHOLHDL 2 11/24/2020   Lab Results  Component Value Date   HGBA1C 5.0 07/06/2019      Assessment & Plan:   #1 patient requesting STD screening.  He does have some mild intermittent urethral discomfort but no discharge.  Doubt acute urethritis  -Check urinalysis-but doubt UTI.  No consistent symptoms of dysuria  -Patient requesting repeat STD screening and will check HIV, RPR, urine cytologies for chlamydia and GC  #2 benign-appearing seborrheic keratosis right lateral hip.  Reassurance given.  We explained this could be treated with liquid nitrogen or shave excision but at this point will observe  No orders of the defined types were placed in this encounter.   Follow-up: No follow-ups on file.    Carolann Littler, MD

## 2021-09-29 LAB — URINE CYTOLOGY ANCILLARY ONLY
Chlamydia: NEGATIVE
Comment: NEGATIVE
Comment: NORMAL
Neisseria Gonorrhea: NEGATIVE

## 2021-09-29 LAB — HIV ANTIBODY (ROUTINE TESTING W REFLEX): HIV 1&2 Ab, 4th Generation: NONREACTIVE

## 2021-09-29 LAB — RPR: RPR Ser Ql: NONREACTIVE

## 2021-10-01 ENCOUNTER — Encounter: Payer: Self-pay | Admitting: Family Medicine

## 2022-05-20 ENCOUNTER — Emergency Department (HOSPITAL_BASED_OUTPATIENT_CLINIC_OR_DEPARTMENT_OTHER): Payer: BC Managed Care – PPO

## 2022-05-20 ENCOUNTER — Other Ambulatory Visit: Payer: Self-pay

## 2022-05-20 ENCOUNTER — Emergency Department (HOSPITAL_BASED_OUTPATIENT_CLINIC_OR_DEPARTMENT_OTHER)
Admission: EM | Admit: 2022-05-20 | Discharge: 2022-05-20 | Disposition: A | Discharge status: Home / Self Care | Payer: BC Managed Care – PPO | Attending: Emergency Medicine | Admitting: Emergency Medicine

## 2022-05-20 ENCOUNTER — Encounter: Payer: Self-pay | Admitting: Family Medicine

## 2022-05-20 ENCOUNTER — Encounter (HOSPITAL_BASED_OUTPATIENT_CLINIC_OR_DEPARTMENT_OTHER): Payer: Self-pay | Admitting: Emergency Medicine

## 2022-05-20 DIAGNOSIS — S0990XA Unspecified injury of head, initial encounter: Secondary | ICD-10-CM | POA: Insufficient documentation

## 2022-05-20 DIAGNOSIS — I6782 Cerebral ischemia: Secondary | ICD-10-CM | POA: Diagnosis not present

## 2022-05-20 DIAGNOSIS — I10 Essential (primary) hypertension: Secondary | ICD-10-CM | POA: Diagnosis not present

## 2022-05-20 DIAGNOSIS — M5412 Radiculopathy, cervical region: Secondary | ICD-10-CM | POA: Diagnosis not present

## 2022-05-20 DIAGNOSIS — M5010 Cervical disc disorder with radiculopathy, unspecified cervical region: Secondary | ICD-10-CM | POA: Diagnosis not present

## 2022-05-20 DIAGNOSIS — M47812 Spondylosis without myelopathy or radiculopathy, cervical region: Secondary | ICD-10-CM | POA: Diagnosis not present

## 2022-05-20 DIAGNOSIS — W01198A Fall on same level from slipping, tripping and stumbling with subsequent striking against other object, initial encounter: Secondary | ICD-10-CM | POA: Diagnosis not present

## 2022-05-20 DIAGNOSIS — J439 Emphysema, unspecified: Secondary | ICD-10-CM | POA: Diagnosis not present

## 2022-05-20 DIAGNOSIS — R079 Chest pain, unspecified: Secondary | ICD-10-CM | POA: Diagnosis not present

## 2022-05-20 DIAGNOSIS — R200 Anesthesia of skin: Secondary | ICD-10-CM

## 2022-05-20 DIAGNOSIS — R519 Headache, unspecified: Secondary | ICD-10-CM | POA: Diagnosis not present

## 2022-05-20 DIAGNOSIS — M545 Low back pain, unspecified: Secondary | ICD-10-CM

## 2022-05-20 LAB — COMPREHENSIVE METABOLIC PANEL
ALT: 17 U/L (ref 0–44)
AST: 19 U/L (ref 15–41)
Albumin: 4.3 g/dL (ref 3.5–5.0)
Alkaline Phosphatase: 59 U/L (ref 38–126)
Anion gap: 9 (ref 5–15)
BUN: 20 mg/dL (ref 8–23)
CO2: 26 mmol/L (ref 22–32)
Calcium: 9.7 mg/dL (ref 8.9–10.3)
Chloride: 103 mmol/L (ref 98–111)
Creatinine, Ser: 1.02 mg/dL (ref 0.61–1.24)
GFR, Estimated: >60 mL/min (ref >60)
Glucose, Bld: 93 mg/dL (ref 70–99)
Potassium: 4.5 mmol/L (ref 3.5–5.1)
Sodium: 138 mmol/L (ref 135–145)
Total Bilirubin: 0.5 mg/dL (ref 0.3–1.2)
Total Protein: 6.7 g/dL (ref 6.5–8.1)

## 2022-05-20 LAB — CBC WITH DIFFERENTIAL/PLATELET
Abs Immature Granulocytes: 0.03 10*3/uL (ref 0.00–0.07)
Basophils Absolute: 0.1 10*3/uL (ref 0.0–0.1)
Basophils Relative: 1 %
Eosinophils Absolute: 0.1 10*3/uL (ref 0.0–0.5)
Eosinophils Relative: 1 %
HCT: 45.5 % (ref 39.0–52.0)
Hemoglobin: 15.4 g/dL (ref 13.0–17.0)
Immature Granulocytes: 0 %
Lymphocytes Relative: 20 %
Lymphs Abs: 1.5 10*3/uL (ref 0.7–4.0)
MCH: 31.2 pg (ref 26.0–34.0)
MCHC: 33.8 g/dL (ref 30.0–36.0)
MCV: 92.3 fL (ref 80.0–100.0)
Monocytes Absolute: 0.6 10*3/uL (ref 0.1–1.0)
Monocytes Relative: 8 %
Neutro Abs: 5.1 10*3/uL (ref 1.7–7.7)
Neutrophils Relative %: 70 %
Platelets: 224 10*3/uL (ref 150–400)
RBC: 4.93 MIL/uL (ref 4.22–5.81)
RDW: 13.1 % (ref 11.5–15.5)
WBC: 7.4 10*3/uL (ref 4.0–10.5)
nRBC: 0 % (ref 0.0–0.2)

## 2022-05-20 LAB — TROPONIN I (HIGH SENSITIVITY): Troponin I (High Sensitivity): 2 ng/L (ref <18)

## 2022-05-20 LAB — CBG MONITORING, ED: Glucose-Capillary: 94 mg/dL (ref 70–99)

## 2022-05-20 NOTE — ED Triage Notes (Addendum)
Pt tripped on a rug approximately 3 days ago. Pt fell forward and struck his head on the ground. Pt denis LOC. Pt denis N/V. Pt is not thinners.   Pt reports new onset of facial numbness to the left side of his face for the last 30  to 60 minutes  Today pt c/o of left shoulder pani that radiates all the way down the arm accompanied by by numbness on the left side of his face. Pt also reports  numbness to his left had last night but problem has resolved.   Pt denis balance issues. A and O x 4 ambulatory  NIH 0

## 2022-05-20 NOTE — ED Notes (Signed)
Patient verbalizes understanding of discharge instructions. Opportunity for questioning and answers were provided. Patient discharged from ED.  °

## 2022-05-20 NOTE — ED Provider Notes (Signed)
[facility] DEPT Provider Note   CSN: [id number] Arrival date & time: 05/20/22  1214     History  Chief Complaint  Patient presents with   Head Injury    Miguel Benjamin is a 66 y.o. male.  He is fairly healthy and physically active.  He is here with various complaints over the past few weeks.  He said the left side of his neck has been hurting intermittently and the pain will radiate down towards his left elbow.  Sometimes associated with some tingling in his left first and second digit.  Seems to be positional and improved with rest.  He had a mechanical fall about a week ago when he fell and struck his head no loss of consciousness.  For the last 3 days he has noticed some pain in his hamstrings bilaterally general soreness when he wakes up in the morning.  Does not stop him from doing any activities.  Not associated with any swelling.  Today at work he noticed some tingling in the left side of his face its been on and off for few hours.  No blurry vision double vision.  No weakness or balance issues.  No recent illness, no medication changes.  No headache.  The history is provided by the patient.  Neurologic Problem This is a new problem. The current episode started 6 to 12 hours ago. The problem occurs hourly. The problem has not changed since onset.Pertinent negatives include no chest pain, no abdominal pain, no headaches and no shortness of breath. Nothing aggravates the symptoms. Nothing relieves the symptoms. He has tried nothing for the symptoms. The treatment provided no relief.       Home Medications Prior to Admission medications   Medication Sig Start Date End Date Taking? Authorizing Provider  amoxicillin-clavulanate (AUGMENTIN) 875-125 MG tablet Take 1 tablet by mouth 2 (two) times daily. 08/10/21   Burchette, Alinda Sierras, MD  Cholecalciferol (VITAMIN D) 2000 units CAPS Take by mouth.    [provider]  cyanocobalamin 500 MCG tablet Take  500 mcg by mouth daily.    [provider]  Glucosamine-Chondroit-Vit C-Mn (GLUCOSAMINE 1500 COMPLEX PO) Take by mouth.    [provider]  Naproxen Sodium (ALEVE PO) Aleve    [provider]  Omega-3 Fatty Acids (FISH OIL) 1000 MG CAPS Take by mouth.    [provider]  tadalafil (CIALIS) 5 MG tablet Take 1 tablet (5 mg total) by mouth daily as needed for erectile dysfunction. 09/09/20   Burchette, Alinda Sierras, MD  tamsulosin (FLOMAX) 0.4 MG CAPS capsule Take 1 capsule (0.4 mg total) by mouth daily. 12/03/20   Burchette, Alinda Sierras, MD  triamcinolone cream (KENALOG) 0.1 % Apply to area once daily for couple of weeks. 09/11/21   Burchette, Alinda Sierras, MD      Allergies    Patient has no known allergies.    Review of Systems   Review of Systems  Constitutional:  Negative for fever.  Eyes:  Negative for visual disturbance.  Respiratory:  Negative for shortness of breath.   Cardiovascular:  Negative for chest pain.  Gastrointestinal:  Negative for abdominal pain.  Musculoskeletal:  Positive for neck pain. Negative for back pain.  Skin:  Negative for rash.  Neurological:  Positive for numbness. Negative for syncope, weakness and headaches.    Physical Exam Updated Vital Signs BP (!) 155/102 (BP Location: Right Arm)   Pulse 91   Temp 98 F (36.7 C)  Resp 16   Ht 6' (1.829 m)   Wt 87.5 kg   SpO2 99%   BMI 26.18 kg/m  Physical Exam Vitals and nursing note reviewed.  Constitutional:      General: He is not in acute distress.    Appearance: Normal appearance. He is well-developed.  HENT:     Head: Normocephalic and atraumatic.  Eyes:     Conjunctiva/sclera: Conjunctivae normal.  Cardiovascular:     Rate and Rhythm: Normal rate and regular rhythm.     Pulses: Normal pulses.     Heart sounds: No murmur heard. Pulmonary:     Effort: Pulmonary effort is normal. No respiratory distress.     Breath sounds: Normal breath sounds.  Abdominal:     Palpations:  Abdomen is soft.     Tenderness: There is no abdominal tenderness. There is no guarding or rebound.  Musculoskeletal:        General: No deformity. Normal range of motion.     Cervical back: Neck supple.     Right lower leg: No edema.     Left lower leg: No edema.  Skin:    General: Skin is warm and dry.     Capillary Refill: Capillary refill takes less than 2 seconds.  Neurological:     General: No focal deficit present.     Mental Status: He is alert and oriented to person, place, and time.     Cranial Nerves: No cranial nerve deficit.     Sensory: No sensory deficit.     Motor: No weakness.     Coordination: Coordination normal.     Gait: Gait normal.     Comments: States maybe he feels a little less in the V2 dermatome facial sensation     ED Results / Procedures / Treatments   Labs (all labs ordered are listed, but only abnormal results are displayed) Labs Reviewed  COMPREHENSIVE METABOLIC PANEL  CBC WITH DIFFERENTIAL/PLATELET  CBG MONITORING, ED  TROPONIN I (HIGH SENSITIVITY)    EKG EKG Interpretation  Date/Time:  Thursday May 20 2022 13:28:25 EDT Ventricular Rate:  89 PR Interval:  132 QRS Duration: 82 QT Interval:  338 QTC Calculation: 411 R Axis:   72 Text Interpretation: Normal sinus rhythm Normal ECG No previous ECGs available Confirmed by Meridee Score [id number]) on 05/20/2022 2:58:21 PM  Radiology CT Cervical Spine Wo Contrast  Result Date: 05/20/2022 CLINICAL DATA:  Fall 3 days previously EXAM: CT CERVICAL SPINE WITHOUT CONTRAST TECHNIQUE: Multidetector CT imaging of the cervical spine was performed without intravenous contrast. Multiplanar CT image reconstructions were also generated. RADIATION DOSE REDUCTION: This exam was performed according to the departmental dose-optimization program which includes automated exposure control, adjustment of the mA and/or kV according to patient size and/or use of iterative reconstruction technique. COMPARISON:   None Available. FINDINGS: Alignment: Normal. Skull base and vertebrae: No acute fracture. No primary bone lesion or focal pathologic process. Soft tissues and spinal canal: No prevertebral fluid or swelling. No visible canal hematoma. Disc levels:  Mild changes of spondylosis at C4-C5, C5-C6 and C6-C7. Upper chest: Negative. Other: None. IMPRESSION: 1. No evidence of acute fracture or malalignment. 2. Mild multilevel cervical spondylosis. Electronically Signed   By: Malachy Moan M.D.   On: 05/20/2022 16:23   CT Head Wo Contrast  Result Date: 05/20/2022 CLINICAL DATA:  Headache.  Fall with head injury 3 days ago. EXAM: CT HEAD WITHOUT CONTRAST TECHNIQUE: Contiguous axial images were obtained from the base of the  skull through the vertex without intravenous contrast. RADIATION DOSE REDUCTION: This exam was performed according to the departmental dose-optimization program which includes automated exposure control, adjustment of the mA and/or kV according to patient size and/or use of iterative reconstruction technique. COMPARISON:  None Available. FINDINGS: Brain: There is no evidence of an acute infarct, intracranial hemorrhage, mass, midline shift, or extra-axial fluid collection. The ventricles and sulci are normal. Hypodensities in the cerebral white matter bilaterally are nonspecific but compatible with mild chronic small vessel ischemic disease. Vascular: Calcified atherosclerosis at the skull base. No hyperdense vessel. Skull: No fracture or suspicious osseous lesion. Sinuses/Orbits: Visualized paranasal sinuses and mastoid air cells are clear. Visualized orbits are unremarkable. Other: None. IMPRESSION: 1. No evidence of acute intracranial abnormality. 2. Mild chronic small vessel ischemic disease. Electronically Signed   By: Sebastian Ache M.D.   On: 05/20/2022 13:16   DG Chest 2 View  Result Date: 05/20/2022 CLINICAL DATA:  Left-sided chest pain and pressure for several months, recent fall EXAM:  CHEST - 2 VIEW COMPARISON:  None Available. FINDINGS: The heart size and mediastinal contours are within normal limits. Pulmonary hyperinflation and emphysema. The visualized skeletal structures are unremarkable. IMPRESSION: Emphysema without acute abnormality of the lungs. Electronically Signed   By: Jearld Lesch M.D.   On: 05/20/2022 13:06    Procedures Procedures    Medications Ordered in ED Medications - No data to display  ED Course/ Medical Decision Making/ A&P                           Medical Decision Making Amount and/or Complexity of Data Reviewed Labs: ordered. Radiology: ordered. ECG/medicine tests: ordered.   This patient complains of head injury, pain left neck down left arm, tingling the left side of face, pain in his hamstrings, abdominal bloating; this involves an extensive number of treatment Options and is a complaint that carries with it a high risk of complications and morbidity. The differential includes bleed, contusion, head injury, radiculopathy, disc disease, dehydration, metabolic derangement  I ordered, reviewed and interpreted labs, which included CBC normal chemistries and LFTs normal troponin unremarkable I ordered imaging studies which included CT head and C-spine chest x-ray and I independently    visualized and interpreted imaging which showed degenerative changes  Previous records obtained and reviewed in epic no recent admissions Cardiac monitoring reviewed, normal sinus rhythm Social determinants considered, no significant barriers Critical Interventions: None  After the interventions stated above, I reevaluated the patient and found patient is well-appearing hemodynamically stable.  Blood pressure is trending up. Admission and further testing considered, recommended close follow-up with PCP for further work-up.  No indications for admission at this time.  Return instructions discussed.         Final Clinical Impression(s) / ED  Diagnoses Final diagnoses:  Head injury  Cervical disc disorder with radiculopathy of cervical region  Left facial numbness  Essential hypertension    Rx / DC Orders ED Discharge Orders     None         Terrilee Files, MD 05/21/22 1053

## 2022-05-20 NOTE — Discharge Instructions (Addendum)
You were seen in the emergency department for evaluation of head injury, neck pain radiating down your left arm, left facial numbness, pain in your hamstrings.  You had blood work EKG CAT scan of your head and neck and chest x-ray that did not show any significant findings.  You did have some degenerative changes in your spine.  Your blood pressure was also elevated here.  You will need follow-up with your primary care doctor for further evaluation of these.  Return to the emergency department if any worsening or concerning symptoms

## 2022-05-20 NOTE — ED Notes (Signed)
Pt told  to x ray, tech that he also has chest pain. Chest X-ray added, along with EKG and appropriate cardiac order set

## 2022-05-21 NOTE — Telephone Encounter (Signed)
Noted  

## 2022-05-24 ENCOUNTER — Ambulatory Visit (INDEPENDENT_AMBULATORY_CARE_PROVIDER_SITE_OTHER): Payer: BC Managed Care – PPO | Admitting: Family Medicine

## 2022-05-24 ENCOUNTER — Encounter: Payer: Self-pay | Admitting: Family Medicine

## 2022-05-24 VITALS — BP 126/60 | HR 100 | Temp 97.8°F | Ht 72.0 in | Wt 191.0 lb

## 2022-05-24 DIAGNOSIS — N401 Enlarged prostate with lower urinary tract symptoms: Secondary | ICD-10-CM

## 2022-05-24 DIAGNOSIS — R202 Paresthesia of skin: Secondary | ICD-10-CM

## 2022-05-24 DIAGNOSIS — R3912 Poor urinary stream: Secondary | ICD-10-CM

## 2022-05-24 DIAGNOSIS — H6123 Impacted cerumen, bilateral: Secondary | ICD-10-CM

## 2022-05-24 DIAGNOSIS — R5383 Other fatigue: Secondary | ICD-10-CM | POA: Diagnosis not present

## 2022-05-24 DIAGNOSIS — M5412 Radiculopathy, cervical region: Secondary | ICD-10-CM

## 2022-05-24 LAB — VITAMIN B12: Vitamin B-12: >1500 pg/mL — ABNORMAL HIGH (ref 211–911)

## 2022-05-24 LAB — TSH: TSH: 1.05 u[IU]/mL (ref 0.35–5.50)

## 2022-05-24 MED ORDER — TAMSULOSIN HCL 0.4 MG PO CAPS: 0.4000 mg | ORAL_CAPSULE | Freq: Every day | ORAL | 3 refills | Status: AC

## 2022-05-24 MED ORDER — PREDNISONE 10 MG PO TABS: ORAL_TABLET | ORAL | 0 refills | Status: DC

## 2022-05-24 NOTE — Progress Notes (Signed)
Established Patient Office Visit  Subjective   Patient ID: Miguel Benjamin, male    DOB: Apr 18, 1956  Age: 66 y.o. MRN: [id number]  Chief Complaint  Patient presents with   Hospitalization Follow-up    HPI   Miguel Benjamin is seen for recent ER evaluation follow-up and for other issues as below  ER notes reviewed from 05-20-2022.  He had complained of some left-sided neck pain with radiation down the left upper extremity.  Intermittent tingling left first and second digit.  Neck pain and upper extremity pain seem to be more positional and improved at rest.  Did describe a mechanical fall about a week prior where he fell and struck his head but there was no loss of consciousness.  What prompted his visit to the ER though was onset around 12 noon of some tingling sensation left side of face around the forehead and cheek region.  No weakness.  No slurred speech.  Denied upper or lower extremity focal weakness.  No headaches.  No acute visual changes.  Several labs including troponins, CBC, comprehensive metabolic panel and these were all normal.  EKG unremarkable.  Chest x-ray showed "emphysema "change without acute abnormality.  He had CT head which showed no acute changes.  He had evidence for mild chronic small vessel ischemic disease.  CT cervical spine no acute fracture but mild multilevel cervical spondylosis.  He has not had any further paresthesias since then and no new neurologic symptoms.  He continues to have sharp pain radiating from the neck down the left upper extremity for about 4 to 5 weeks now.  No chest pain.  Also relates slow stream with urination and more frequent nocturia.  Had been prescribed tamsulosin in the past but never took.  He denies any burning with urination.  No fever.  Has seen urologist in the past for elevated PSA but more recent PSAs were trending down.  He has some fullness in both ears right greater than left.  Is worried about cerumen impaction which he has had  previously in the past.  He has had some nonspecific malaise issues recently.  Appetite and weight stable.  He is actually gained a little weight and is struggled to lose and feels somewhat bloated frequently abdominal region.  No nausea or vomiting.  No stool changes.  Past Medical History:  Diagnosis Date   Allergy    Arthritis    COPD (chronic obstructive pulmonary disease) ([place])    Past Surgical History:  Procedure Laterality Date   HERNIA REPAIR     knee reconstruction on right knee     SHOULDER SURGERY     TONSILLECTOMY     TOTAL KNEE ARTHROPLASTY Left 08/01/2017   Procedure: LEFT TOTAL KNEE ARTHROPLASTY;  Surgeon: Gaynelle Arabian, MD;  Location: [facility];  Service: Orthopedics;  Laterality: Left;  Adductor Block   VASECTOMY Bilateral 2021    reports that he has quit smoking. He has never used smokeless tobacco. He reports current alcohol use. He reports that he does not use drugs. family history includes Cancer in his father and sister; Cardiomyopathy in his father; Hyperthyroidism in his mother; Lung cancer in his father. No Known Allergies  Review of Systems  Constitutional:  Positive for malaise/fatigue. Negative for chills, fever and weight loss.  HENT:  Negative for ear discharge and ear pain.   Respiratory:  Negative for cough and shortness of breath.   Cardiovascular:  Negative for chest pain.  Genitourinary:  Negative for dysuria.  Musculoskeletal:  Positive for neck pain.  Skin:  Negative for rash.  Neurological:  Positive for tingling. Negative for dizziness, speech change, focal weakness, seizures, loss of consciousness and headaches.      Objective:     BP 126/60 (BP Location: Left Arm, Patient Position: Sitting, Cuff Size: Normal)   Pulse 100   Temp 97.8 F (36.6 C) (Oral)   Ht 6' (1.829 m)   Wt 191 lb (86.6 kg)   SpO2 98%   BMI 25.90 kg/m    Physical Exam Vitals reviewed.  Constitutional:      General: He is not in acute distress.    Appearance:  Normal appearance. He is not ill-appearing.  HENT:     Ears:     Comments: Cerumen impaction bilaterally Cardiovascular:     Rate and Rhythm: Normal rate and regular rhythm.  Pulmonary:     Effort: Pulmonary effort is normal.     Breath sounds: Normal breath sounds.  Musculoskeletal:     Cervical back: Neck supple.     Right lower leg: No edema.     Left lower leg: No edema.  Neurological:     General: No focal deficit present.     Mental Status: He is alert.     Cranial Nerves: No cranial nerve deficit.     Motor: No weakness.     Gait: Gait normal.     Comments: Does have upper extremity tremor bilaterally which does not extinguish with movement and intention.  No cogwheel rigidity.  Normal gait.  Full strength upper extremities.  No muscle atrophy upper extremities.  1+ reflexes brachioradialis and biceps bilaterally      Results for orders placed or performed in visit on 05/24/22  Vitamin B12  Result Value Ref Range   Vitamin B-12 >1500 (H) 211 - 911 pg/mL  TSH  Result Value Ref Range   TSH 1.05 0.35 - 5.50 uIU/mL      The ASCVD Risk score (Arnett DK, et al., 2019) failed to calculate for the following reasons:   The valid HDL cholesterol range is 20 to 100 mg/dL    Assessment & Plan:   #1 recent paresthesias left side of face.  No associated weakness.  CT head unrevealing.  Etiology unclear.  We explained this was not likely coming from his neck issue.  No evidence for Bell's palsy or any focal facial weakness.  Symptoms resolved at this time. -He also complained of intermittent paresthesias feet.  We will check B12 and TSH.  He has no history of diabetes. -Or neurology evaluation if symptoms recur or persist  #2 left cervical radiculitis symptoms.  Going on for about 5 weeks now.  Recommend trial of prednisone taper and touch base in a couple weeks if not improving.  May need MRI cervical spine to further assess  #3 BPH symptoms with nocturia and slow stream.   Prescription for Flomax 0.4 mg 1 nightly.  Be in touch if this is not improving symptoms over the next couple of weeks  #4 bilateral cerumen impaction.  Discuss risk of ear canal irrigation and patient consented.  Both ear canals were irrigated with removal of cerumen.  His eardrums appear normal.  He tolerated well.   No follow-ups on file.    Carolann Littler, MD

## 2022-06-01 NOTE — Addendum Note (Signed)
Addended by: Eulas Post on: 06/01/2022 08:45 PM   Modules accepted: Orders

## 2022-06-03 NOTE — Telephone Encounter (Signed)
I am doing my best.   I will try to add MRI lumbar spine.  We still have to wait for insurance approval for any MRI.   Eulas Post MD [facility] at [facility]

## 2022-06-03 NOTE — Addendum Note (Signed)
Addended by: Eulas Post on: 06/03/2022 11:12 AM   Modules accepted: Orders

## 2022-06-11 DIAGNOSIS — M542 Cervicalgia: Secondary | ICD-10-CM | POA: Diagnosis not present

## 2022-06-11 DIAGNOSIS — M25512 Pain in left shoulder: Secondary | ICD-10-CM | POA: Diagnosis not present

## 2022-06-11 DIAGNOSIS — M79605 Pain in left leg: Secondary | ICD-10-CM | POA: Diagnosis not present

## 2022-06-11 DIAGNOSIS — M79602 Pain in left arm: Secondary | ICD-10-CM | POA: Diagnosis not present

## 2022-06-15 DIAGNOSIS — Z6826 Body mass index (BMI) 26.0-26.9, adult: Secondary | ICD-10-CM | POA: Diagnosis not present

## 2022-06-15 DIAGNOSIS — M5412 Radiculopathy, cervical region: Secondary | ICD-10-CM | POA: Diagnosis not present

## 2022-06-15 DIAGNOSIS — M542 Cervicalgia: Secondary | ICD-10-CM | POA: Diagnosis not present

## 2022-06-17 ENCOUNTER — Other Ambulatory Visit: Payer: Self-pay

## 2022-06-17 MED ORDER — TADALAFIL 5 MG PO TABS: 5.0000 mg | ORAL_TABLET | Freq: Every day | ORAL | 0 refills | Status: DC | PRN

## 2022-06-18 DIAGNOSIS — M79605 Pain in left leg: Secondary | ICD-10-CM | POA: Diagnosis not present

## 2022-06-18 DIAGNOSIS — M542 Cervicalgia: Secondary | ICD-10-CM | POA: Diagnosis not present

## 2022-06-18 DIAGNOSIS — M79602 Pain in left arm: Secondary | ICD-10-CM | POA: Diagnosis not present

## 2022-06-18 DIAGNOSIS — M25512 Pain in left shoulder: Secondary | ICD-10-CM | POA: Diagnosis not present

## 2022-06-23 DIAGNOSIS — M79602 Pain in left arm: Secondary | ICD-10-CM | POA: Diagnosis not present

## 2022-06-23 DIAGNOSIS — M79605 Pain in left leg: Secondary | ICD-10-CM | POA: Diagnosis not present

## 2022-06-23 DIAGNOSIS — M25512 Pain in left shoulder: Secondary | ICD-10-CM | POA: Diagnosis not present

## 2022-06-23 DIAGNOSIS — M542 Cervicalgia: Secondary | ICD-10-CM | POA: Diagnosis not present

## 2022-07-06 ENCOUNTER — Other Ambulatory Visit (HOSPITAL_BASED_OUTPATIENT_CLINIC_OR_DEPARTMENT_OTHER): Payer: Self-pay | Admitting: Student

## 2022-07-06 DIAGNOSIS — M5010 Cervical disc disorder with radiculopathy, unspecified cervical region: Secondary | ICD-10-CM

## 2022-07-08 DIAGNOSIS — M79602 Pain in left arm: Secondary | ICD-10-CM | POA: Diagnosis not present

## 2022-07-08 DIAGNOSIS — M79605 Pain in left leg: Secondary | ICD-10-CM | POA: Diagnosis not present

## 2022-07-08 DIAGNOSIS — M542 Cervicalgia: Secondary | ICD-10-CM | POA: Diagnosis not present

## 2022-07-08 DIAGNOSIS — M25512 Pain in left shoulder: Secondary | ICD-10-CM | POA: Diagnosis not present

## 2022-07-09 ENCOUNTER — Ambulatory Visit (HOSPITAL_BASED_OUTPATIENT_CLINIC_OR_DEPARTMENT_OTHER)
Admission: RE | Admit: 2022-07-09 | Discharge: 2022-07-09 | Disposition: A | Discharge status: Home / Self Care | Payer: BC Managed Care – PPO | Source: Ambulatory Visit | Attending: Student | Admitting: Student

## 2022-07-09 DIAGNOSIS — M4802 Spinal stenosis, cervical region: Secondary | ICD-10-CM | POA: Diagnosis not present

## 2022-07-09 DIAGNOSIS — M5010 Cervical disc disorder with radiculopathy, unspecified cervical region: Secondary | ICD-10-CM | POA: Insufficient documentation

## 2022-07-09 DIAGNOSIS — M50221 Other cervical disc displacement at C4-C5 level: Secondary | ICD-10-CM | POA: Diagnosis not present

## 2022-07-09 DIAGNOSIS — M542 Cervicalgia: Secondary | ICD-10-CM | POA: Diagnosis not present

## 2022-07-13 DIAGNOSIS — M542 Cervicalgia: Secondary | ICD-10-CM | POA: Diagnosis not present

## 2022-07-13 DIAGNOSIS — M4712 Other spondylosis with myelopathy, cervical region: Secondary | ICD-10-CM | POA: Diagnosis not present

## 2022-07-13 DIAGNOSIS — Z6826 Body mass index (BMI) 26.0-26.9, adult: Secondary | ICD-10-CM | POA: Diagnosis not present

## 2023-04-08 ENCOUNTER — Encounter: Payer: Self-pay | Admitting: Family Medicine

## 2023-04-08 MED ORDER — TADALAFIL 5 MG PO TABS: 5.0000 mg | ORAL_TABLET | Freq: Every day | ORAL | 0 refills | Status: DC | PRN

## 2023-08-05 ENCOUNTER — Encounter: Payer: Self-pay | Admitting: Family Medicine

## 2023-08-05 ENCOUNTER — Telehealth (HOSPITAL_COMMUNITY): Payer: Self-pay

## 2023-08-05 MED ORDER — NIRMATRELVIR/RITONAVIR (PAXLOVID)TABLET: 3.0000 | ORAL_TABLET | Freq: Two times a day (BID) | ORAL | 0 refills | Status: DC

## 2023-08-05 NOTE — Telephone Encounter (Signed)
Patient called with onset yesterday of sore throat and had some cough today.  Positive COVID test.  Generally healthy.  Takes no prescription medications other than Flomax.  No dyspnea.  No nausea or vomiting.  No history of renal dysfunction.  We agreed to call in Paxlovid 3 capsules twice daily for 5 days.  Follow-up immediately for any shortness of breath or worsening symptoms.  Kristian Covey MD [facility] at [facility]

## 2023-08-06 ENCOUNTER — Ambulatory Visit (HOSPITAL_COMMUNITY): Payer: BC Managed Care – PPO

## 2023-08-08 ENCOUNTER — Ambulatory Visit (INDEPENDENT_AMBULATORY_CARE_PROVIDER_SITE_OTHER): Payer: Managed Care, Other (non HMO) | Admitting: Family Medicine

## 2023-08-08 ENCOUNTER — Encounter: Payer: Self-pay | Admitting: Family Medicine

## 2023-08-08 ENCOUNTER — Ambulatory Visit: Payer: BC Managed Care – PPO | Admitting: Family Medicine

## 2023-08-08 VITALS — BP 150/84 | HR 95 | Temp 96.9°F | Ht 72.0 in | Wt 192.4 lb

## 2023-08-08 DIAGNOSIS — R931 Abnormal findings on diagnostic imaging of heart and coronary circulation: Secondary | ICD-10-CM

## 2023-08-08 DIAGNOSIS — E785 Hyperlipidemia, unspecified: Secondary | ICD-10-CM

## 2023-08-08 DIAGNOSIS — Z8249 Family history of ischemic heart disease and other diseases of the circulatory system: Secondary | ICD-10-CM

## 2023-08-08 DIAGNOSIS — U071 COVID-19: Secondary | ICD-10-CM | POA: Diagnosis not present

## 2023-08-08 MED ORDER — AMOXICILLIN-POT CLAVULANATE 875-125 MG PO TABS: 1.0000 | ORAL_TABLET | Freq: Two times a day (BID) | ORAL | 0 refills | Status: DC

## 2023-08-08 MED ORDER — PREDNISONE 20 MG PO TABS: ORAL_TABLET | ORAL | 0 refills | Status: DC

## 2023-08-08 NOTE — Progress Notes (Signed)
Established Patient Office Visit  Subjective   Patient ID: Miguel Benjamin, male    DOB: 03-14-1956  Age: 67 y.o. MRN: [id number]  Chief Complaint  Patient presents with   Cough    HPI   Miguel Benjamin is seen with COVID.  He had called on Friday with positive COVID test.  Last Thursday he developed some sore throat and cough by Friday.  Positive COVID test on Friday.  We agreed to call in Paxlovid.  He went to pick up prescription but was told it would be $450 and he never got this filled.  He actually felt much better Saturday and Sunday.  After having some fever on Friday he had none over the weekend.  His concern was this morning he had transient O2 sat of 72 to 75%.  Up in the upper 90s since then.  No pleuritic pain.  No dyspnea at this time.  Does have some cough which is mostly nonproductive but he feels like he has a lot of mucus in his chest.  Is taken over-the-counter Mucinex.  Denies any nausea, vomiting, or diarrhea.  Actually rode his Peloton bike for about 30 minutes this morning but not had a high level of intensity.  He is a former smoker.  He states he had a brother.  That died age 32 of coronary artery disease complications just couple weeks ago.  He is concerned about his risk.  He has have history of mildly elevated cholesterol with last lipids about 2 years ago.  Past Medical History:  Diagnosis Date   Allergy    Arthritis    COPD (chronic obstructive pulmonary disease) ([facility])    Past Surgical History:  Procedure Laterality Date   HERNIA REPAIR     knee reconstruction on right knee     SHOULDER SURGERY     TONSILLECTOMY     TOTAL KNEE ARTHROPLASTY Left 08/01/2017   Procedure: LEFT TOTAL KNEE ARTHROPLASTY;  Surgeon: Ollen Gross, MD;  Location: [facility];  Service: Orthopedics;  Laterality: Left;  Adductor Block   VASECTOMY Bilateral 2021    reports that he has quit smoking. He has never used smokeless tobacco. He reports current alcohol use. He reports that he does  not use drugs. family history includes Cancer in his father and sister; Cardiomyopathy in his father; Hyperthyroidism in his mother; Lung cancer in his father. No Known Allergies  Review of Systems  Constitutional:  Negative for chills and fever.  Respiratory:  Positive for cough. Negative for hemoptysis and sputum production.   Gastrointestinal:  Negative for nausea and vomiting.      Objective:     BP (!) 150/84 (BP Location: Left Arm, Patient Position: Sitting, Cuff Size: Normal)   Pulse 95   Temp (!) 96.9 F (36.1 C) (Axillary)   Ht 6' (1.829 m)   Wt 192 lb 6.4 oz (87.3 kg)   SpO2 98%   BMI 26.09 kg/m  BP Readings from Last 3 Encounters:  08/08/23 (!) 150/84  05/24/22 126/60  05/20/22 (!) 167/105   Wt Readings from Last 3 Encounters:  08/08/23 192 lb 6.4 oz (87.3 kg)  05/24/22 191 lb (86.6 kg)  05/20/22 193 lb (87.5 kg)      Physical Exam Vitals reviewed.  Constitutional:      General: He is not in acute distress.    Appearance: He is not ill-appearing or toxic-appearing.  HENT:     Right Ear: Tympanic membrane normal.     Left Ear:  Tympanic membrane normal.  Cardiovascular:     Rate and Rhythm: Normal rate and regular rhythm.  Pulmonary:     Effort: Pulmonary effort is normal.     Breath sounds: Normal breath sounds. No wheezing or rales.  Neurological:     Mental Status: He is alert.      No results found for any visits on 08/08/23.    The ASCVD Risk score (Arnett DK, et al., 2019) failed to calculate for the following reasons:   The valid HDL cholesterol range is 20 to 100 mg/dL    Assessment & Plan:   #1 COVID-19 infection.  Patient was diagnosed last Friday.  He called in earlier today with O2 sats 75% but currently 98%.  Has a few faint expiratory wheezes.  No respiratory distress at this time.  Does not any pleuritic pain or other indicators to suggest pulmonary embolus.  -He is getting ready to travel to [place].  We discussed starting  prednisone 20 mg 2 tablets daily for 5 to 6 days -Decided to go and cover with Augmentin 875 mg twice daily for 7 days -Follow-up immediately for any increased shortness of breath or worsening fever -Also continue to monitor O2 sats and be in touch for any O2 sats less than 90%  #2 family history of CAD.  He had a 45 year old brother that recently died of sudden MI.  Malone would like to go ahead with coronary calcium score.  This will be ordered  Evelena Peat, MD

## 2023-08-08 NOTE — Patient Instructions (Addendum)
Follow up for any increased shortness of breath or con sistent O 2 sats < 90 %.

## 2023-08-08 NOTE — Telephone Encounter (Addendum)
I attempted to contact the patient to inform him to proceed to nearest urgent care and Ed for evaluation.  Left message for patient to return my call also mychart message sent

## 2023-08-10 ENCOUNTER — Ambulatory Visit (HOSPITAL_COMMUNITY)
Admission: RE | Admit: 2023-08-10 | Discharge: 2023-08-10 | Disposition: A | Discharge status: Home / Self Care | Payer: Self-pay | Source: Ambulatory Visit | Attending: Family Medicine | Admitting: Family Medicine

## 2023-08-10 DIAGNOSIS — E785 Hyperlipidemia, unspecified: Secondary | ICD-10-CM

## 2023-08-10 DIAGNOSIS — Z8249 Family history of ischemic heart disease and other diseases of the circulatory system: Secondary | ICD-10-CM

## 2023-08-12 ENCOUNTER — Encounter: Payer: Self-pay | Admitting: Family Medicine

## 2023-08-12 NOTE — Telephone Encounter (Signed)
Please see result note 

## 2023-08-12 NOTE — Addendum Note (Signed)
Addended by: Christy Sartorius on: 08/12/2023 11:08 AM   Modules accepted: Orders

## 2023-08-29 ENCOUNTER — Encounter: Payer: Self-pay | Admitting: Family Medicine

## 2023-08-29 DIAGNOSIS — Z833 Family history of diabetes mellitus: Secondary | ICD-10-CM

## 2023-08-29 NOTE — Telephone Encounter (Signed)
 I added A1C to follow up labs tomorrow.  Kristian Covey MD [facility] at [facility]

## 2023-08-30 ENCOUNTER — Other Ambulatory Visit (INDEPENDENT_AMBULATORY_CARE_PROVIDER_SITE_OTHER): Payer: Managed Care, Other (non HMO)

## 2023-08-30 DIAGNOSIS — R931 Abnormal findings on diagnostic imaging of heart and coronary circulation: Secondary | ICD-10-CM

## 2023-08-30 DIAGNOSIS — Z833 Family history of diabetes mellitus: Secondary | ICD-10-CM

## 2023-08-30 LAB — HEPATIC FUNCTION PANEL
ALT: 15 U/L (ref 0–53)
AST: 17 U/L (ref 0–37)
Albumin: 4 g/dL (ref 3.5–5.2)
Alkaline Phosphatase: 68 U/L (ref 39–117)
Bilirubin, Direct: 0.1 mg/dL (ref 0.0–0.3)
Total Bilirubin: 0.4 mg/dL (ref 0.2–1.2)
Total Protein: 5.8 g/dL — ABNORMAL LOW (ref 6.0–8.3)

## 2023-08-30 LAB — LIPID PANEL
Cholesterol: 227 mg/dL — ABNORMAL HIGH (ref 0–200)
HDL: 91.3 mg/dL (ref >39.00)
LDL Cholesterol: 123 mg/dL — ABNORMAL HIGH (ref 0–99)
NonHDL: 135.27
Total CHOL/HDL Ratio: 2
Triglycerides: 59 mg/dL (ref 0.0–149.0)
VLDL: 11.8 mg/dL (ref 0.0–40.0)

## 2023-08-30 LAB — HEMOGLOBIN A1C: Hgb A1c MFr Bld: 5.5 % (ref 4.6–6.5)

## 2023-09-02 ENCOUNTER — Encounter: Payer: Self-pay | Admitting: Family Medicine

## 2023-09-02 ENCOUNTER — Ambulatory Visit (INDEPENDENT_AMBULATORY_CARE_PROVIDER_SITE_OTHER): Payer: Managed Care, Other (non HMO) | Admitting: Family Medicine

## 2023-09-02 VITALS — BP 124/80 | HR 88 | Temp 97.9°F | Wt 194.0 lb

## 2023-09-02 DIAGNOSIS — R931 Abnormal findings on diagnostic imaging of heart and coronary circulation: Secondary | ICD-10-CM

## 2023-09-02 DIAGNOSIS — E785 Hyperlipidemia, unspecified: Secondary | ICD-10-CM

## 2023-09-02 MED ORDER — ATORVASTATIN CALCIUM 20 MG PO TABS: 20.0000 mg | ORAL_TABLET | Freq: Every day | ORAL | 3 refills | Status: DC

## 2023-09-02 NOTE — Progress Notes (Signed)
 Established Patient Office Visit  Subjective   Patient ID: Miguel Benjamin, male    DOB: 1956/02/29  Age: 68 y.o. MRN: [id number]  Chief Complaint  Patient presents with   Results    HPI   [name] is seen for follow-up regarding recent coronary calcium  score and lab work.  He had total cholesterol 227 with LDL 123.  HDL 91.  Normal triglycerides.  Positive family history of CAD brother and father.  He had coronary calcium  score of 331.  Most of this was LAD but he had some left main and left circumflex disease as well.  59th percentile.  We had suggested starting baby aspirin and statin and patient wished to discuss first.  He quit smoking 8 years ago.  No history of diabetes.  Recent A1c 5.5%.  No recent chest pain.  Currently exercises 25 to 30 minutes about 6 days/week.  No recent exertional symptoms.  Past Medical History:  Diagnosis Date   Allergy    Arthritis    COPD (chronic obstructive pulmonary disease) ([facility])    Past Surgical History:  Procedure Laterality Date   HERNIA REPAIR     knee reconstruction on right knee     SHOULDER SURGERY     TONSILLECTOMY     TOTAL KNEE ARTHROPLASTY Left 08/01/2017   Procedure: LEFT TOTAL KNEE ARTHROPLASTY;  Surgeon: Melodi Lerner, MD;  Location: [facility];  Service: Orthopedics;  Laterality: Left;  Adductor Block   VASECTOMY Bilateral 2021    reports that he has quit smoking. He has never used smokeless tobacco. He reports current alcohol use. He reports that he does not use drugs. family history includes Cancer in his father and sister; Cardiomyopathy in his father; Hyperthyroidism in his mother; Lung cancer in his father. No Known Allergies  Review of Systems  Constitutional:  Negative for malaise/fatigue.  Eyes:  Negative for blurred vision.  Respiratory:  Negative for shortness of breath.   Cardiovascular:  Negative for chest pain.  Neurological:  Negative for dizziness, weakness and headaches.      Objective:     BP  124/80 (BP Location: Left Arm, Patient Position: Sitting, Cuff Size: Normal)   Pulse 88   Temp 97.9 F (36.6 C) (Oral)   Wt 194 lb (88 kg)   SpO2 98%   BMI 26.31 kg/m  BP Readings from Last 3 Encounters:  09/02/23 124/80  08/08/23 (!) 150/84  05/24/22 126/60   Wt Readings from Last 3 Encounters:  09/02/23 194 lb (88 kg)  08/08/23 192 lb 6.4 oz (87.3 kg)  05/24/22 191 lb (86.6 kg)      Physical Exam Vitals reviewed.  Constitutional:      Appearance: He is well-developed.  Neck:     Thyroid : No thyromegaly.  Cardiovascular:     Rate and Rhythm: Normal rate and regular rhythm.  Pulmonary:     Effort: Pulmonary effort is normal. No respiratory distress.     Breath sounds: Normal breath sounds. No wheezing or rales.  Neurological:     Mental Status: He is alert.      No results found for any visits on 09/02/23.  Last metabolic panel Lab Results  Component Value Date   GLUCOSE 93 05/20/2022   NA 138 05/20/2022   K 4.5 05/20/2022   CL 103 05/20/2022   CO2 26 05/20/2022   BUN 20 05/20/2022   CREATININE 1.02 05/20/2022   GFRNONAA >60 05/20/2022   CALCIUM  9.7 05/20/2022   PROT 5.8 (L)  08/30/2023   ALBUMIN 4.0 08/30/2023   BILITOT 0.4 08/30/2023   ALKPHOS 68 08/30/2023   AST 17 08/30/2023   ALT 15 08/30/2023   ANIONGAP 9 05/20/2022   Last lipids Lab Results  Component Value Date   CHOL 227 (H) 08/30/2023   HDL 91.30 08/30/2023   LDLCALC 123 (H) 08/30/2023   LDLDIRECT 133.4 07/11/2012   TRIG 59.0 08/30/2023   CHOLHDL 2 08/30/2023   Last hemoglobin A1c Lab Results  Component Value Date   HGBA1C 5.5 08/30/2023      The 10-year ASCVD risk score (Arnett DK, et al., 2019) is: 11.1%    Assessment & Plan:   Problem List Items Addressed This Visit   None Visit Diagnoses       Hyperlipidemia, unspecified hyperlipidemia type    -  Primary   Relevant Medications   atorvastatin  (LIPITOR) 20 MG tablet   Other Relevant Orders   Lipid panel   Hepatic  function panel     Elevated coronary artery calcium  score       Relevant Medications   atorvastatin  (LIPITOR) 20 MG tablet   Other Relevant Orders   Ambulatory referral to Cardiology     68 year old male with history of hyperlipidemia and elevated coronary calcium  scores above of 331.  Positive family history of CAD in his brother and father.  We discussed several items as follows  -Recommend start baby aspirin 81 mg daily -Start atorvastatin  20 mg daily.  Goal LDL less than 70 -Continue minimum 150 minutes/week of moderate intensity exercise -Set up cardiology referral -Low saturated fat diet.  We discussed Mediterranean type diet and sounds like he is already eating similar type diet -Set up complete physical soon  No follow-ups on file.    [name], MD

## 2023-09-02 NOTE — Patient Instructions (Addendum)
 Consider starting baby aspirin 81 mg daily.    I am setting up cardiology referral.    Set up physical in 6-8 weeks.

## 2023-11-18 NOTE — Progress Notes (Signed)
 Cardiology Office Note:  .   Date:  11/23/2023 ID:  Miguel Benjamin, DOB [birth year], MRN [id number] PCP: Kristian Covey, MD [facility] Providers Cardiologist:  None   Patient Profile: .      PMH Hyperlipidemia Coronary artery disease CT calcium score 08/10/23 CAC score 331 (59th percentile) LM 37.5, LAD 254, LCx 39.5, RCA 0 COPD Family history CAD Brother died suddenly from MI age 68 Heart disease in father Cervical spinal stenosis Former tobacco abuse 30 pack year history Quit smoking 2014 Statin myalgia On atorvastatin        History of Present Illness: .   Miguel Benjamin is a very pleasant 68 y.o. male  who is here today for new patient consult for elevated coronary calcium score. 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 Prior to the pandemic, he traveled constantly and did not exercise regularly. Since that time, he has resumed regular exercise. He is concerned about heart disease due to family history. His younger brother recently died from a heart attack and his father died at 56 from multiple health issues, including heart disease. He quit smoking about 12 years ago. He is physically active, going to the gym regularly and playing golf and pickleball. He has been experiencing joint aches since starting atorvastatin. He is somewhat limited by knee pain, so cardio exercise is challenging, but he rides stationary bike for about 25 minutes and then lifts weights/uses machines. He enjoys cooking, and admits to frequent consumption of pasta but makes his own. Has been aiming to lose weight by eating less calories throughout the day. He never eats breakfast.  denies chest pain, shortness of breath, lower extremity edema, fatigue, palpitations, melena, hematuria, hemoptysis, diaphoresis, weakness, presyncope, syncope, orthopnea, and PND.   Family history: His family history includes Cancer in his father and  sister; Cardiomyopathy in his father; Hyperthyroidism in his mother; Lung cancer in his father.  Mother is 34 still lives independently in [place]   Discussed the use of AI scribe software for clinical note transcription with the patient, who gave verbal consent to proceed.  ASCVD Risk Score: The 10-year ASCVD risk score (Arnett DK, et al., 2019) is: 13.2%   Values used to calculate the score:     Age: 32 years     Sex: Male     Is Non-Hispanic African American: No     Diabetic: No     Tobacco smoker: No     Systolic Blood Pressure: 126 mmHg     Is BP treated: Yes     HDL Cholesterol: 91.3 mg/dL     Total Cholesterol: 227 mg/dL    Diet: Not a breakfast eater Salmon 1-3 x per week Ate pescatarian diet for 15 years but recently resumed intake of red meat Loves pasta, but makes his own [employer] once weekly Fruit or salad typically for lunch  Activity: Gym 5 days per week 25 minutes bike - HR to 130 bpm Weight lifting/equipment Tennis, golf, pickleball  No results found for: "LIPOA"    ROS: See HPI       Studies Reviewed: Marland Kitchen   EKG Interpretation Date/Time:  Wednesday November 23 2023 10:07:32 EDT Ventricular Rate:  90 PR Interval:  132 QRS Duration:  84 QT Interval:  340 QTC Calculation: 415 R Axis:   70  Text Interpretation: Normal sinus rhythm Normal ECG When compared with ECG of 20-May-2022 13:28, No significant change was found Confirmed by  Eligha Bridegroom [id number]) on 11/23/2023 10:16:28 AM      Risk Assessment/Calculations:             Physical Exam:   VS: BP 126/80 (BP Location: Right Arm, Patient Position: Sitting, Cuff Size: Normal)   Pulse 91   Ht 6' (1.829 m)   Wt 197 lb 6.4 oz (89.5 kg)   SpO2 96%   BMI 26.77 kg/m   Wt Readings from Last 3 Encounters:  11/23/23 197 lb 6.4 oz (89.5 kg)  09/02/23 194 lb (88 kg)  08/08/23 192 lb 6.4 oz (87.3 kg)     GEN: Well nourished, well developed in no acute distress NECK: No JVD; No carotid  bruits CARDIAC: RRR, no murmurs, rubs, gallops RESPIRATORY:  Clear to auscultation without rales, wheezing or rhonchi  ABDOMEN: Soft, non-tender, non-distended EXTREMITIES:  No edema; No deformity     ASSESSMENT AND PLAN: .    CAD: Elevated CAC score 331 (59th percentile) with bulk of calcium in LAD.  He is active, but is limited in cardiovascular exercise due to knee pain. No chest pain, DOE or other symptoms concerning for angina, however given elevated ASCVD risk, former tobacco abuse, and family history, would like to get further testing for ischemia evaluation. EKG today reveals NSR and no concerning ST abnormality. He is planning to undergo knee replacement surgery and would like to get stress test prior to surgery.  We will get coronary CTA to evaluate for ischemia. Will have him take lopressor 50 mg prior to test. He is not tolerating atorvastatin 20 mg daily due to joint pain.  We will switch him to rosuvastatin and get repeat lipid panel in 2 to 3 months. Emphasized the importance of heart healthy mostly plant based diet avoiding saturated fat, processed foods, simple carbohydrates, and sugar along with aiming for at least 150 minutes of moderate intensity exercise each week.   Hyperlipidemia LDL goal < 70: Lipid panel completed 08/30/23 with total cholesterol 227, triglycerides 59, HDL 91, LDL 123. He started atorvastatin 20 mg a few months ago and is having increased joint pain. Will switch him to rosuvastatin 20 mg daily. Advised him to notify us of concerning symptoms. Will get NMR, LP(a) and ALT in 2-3 months to evaluate statin effect.   Vascular disease: Head CT 05/20/2022 notes calcified atherosclerosis at skull base.  He asks about a bulge in his neck on the right side.  We will get carotid duplex to evaluate for stenosis of neck arteries.  CV Risk Assessment: ASCVD Risk score 13.2%.  We discussed the individual components including history of elevated cholesterol, however he does not  have history of diabetes or hypertension.  He quit smoking approximately 11 years ago.  Has not tolerated atorvastatin 20 mg due to joint aches, will try rosuvastatin 20 mg daily, high intensity statin due to risk score and LDL above goal of < 70. Will get coronary CTA to evaluate for ischemia.    Plan/Goals: 1: Aim to increase intake of high protein, high fiber foods and avoid prolonged periods of fasting 2: Continue to aim for 150 minutes of moderate intensity exercise each week       Disposition: 3 months with me  Signed, Eligha Bridegroom, NP-C

## 2023-11-23 ENCOUNTER — Encounter (HOSPITAL_BASED_OUTPATIENT_CLINIC_OR_DEPARTMENT_OTHER): Payer: Self-pay | Admitting: Nurse Practitioner

## 2023-11-23 ENCOUNTER — Ambulatory Visit (HOSPITAL_BASED_OUTPATIENT_CLINIC_OR_DEPARTMENT_OTHER): Payer: Managed Care, Other (non HMO) | Admitting: Nurse Practitioner

## 2023-11-23 ENCOUNTER — Encounter (HOSPITAL_BASED_OUTPATIENT_CLINIC_OR_DEPARTMENT_OTHER): Payer: Self-pay

## 2023-11-23 VITALS — BP 126/80 | HR 91 | Ht 72.0 in | Wt 197.4 lb

## 2023-11-23 DIAGNOSIS — I2510 Atherosclerotic heart disease of native coronary artery without angina pectoris: Secondary | ICD-10-CM

## 2023-11-23 DIAGNOSIS — R931 Abnormal findings on diagnostic imaging of heart and coronary circulation: Secondary | ICD-10-CM

## 2023-11-23 DIAGNOSIS — Z7189 Other specified counseling: Secondary | ICD-10-CM

## 2023-11-23 DIAGNOSIS — I999 Unspecified disorder of circulatory system: Secondary | ICD-10-CM

## 2023-11-23 DIAGNOSIS — E785 Hyperlipidemia, unspecified: Secondary | ICD-10-CM

## 2023-11-23 MED ORDER — METOPROLOL TARTRATE 50 MG PO TABS: ORAL_TABLET | ORAL | 0 refills | Status: DC

## 2023-11-23 MED ORDER — ROSUVASTATIN CALCIUM 20 MG PO TABS: 20.0000 mg | ORAL_TABLET | Freq: Every day | ORAL | 3 refills | Status: DC

## 2023-11-23 NOTE — Patient Instructions (Addendum)
 Medication Instructions:   DISCONTINUE Atorvastatin.  START Rosuvastatin one (1) tablet by mouth ( 20 mg) daily one week after stopping the atorvastatin.  *If you need a refill on your cardiac medications before your next appointment, please call your pharmacy*  Lab Work:  Your physician recommends that you return for a FASTING LPA/NMR/ALT in 2-3 months fasting after midnight prior to your 3 month follow up appointment in July. Paperwork given to pt today.    If you have labs (blood work) drawn today and your tests are completely normal, you will receive your results only by: MyChart Message (if you have MyChart) OR A paper copy in the mail If you have any lab test that is abnormal or we need to change your treatment, we will call you to review the results.  Testing/Procedures:  Your physician has requested that you have a carotid duplex. This test is an ultrasound of the carotid arteries in your neck. It looks at blood flow through these arteries that supply the brain with blood. Allow one hour for this exam. There are no restrictions or special instructions. Pt will send mychart message when pt gets to office to give dates to schedule testing.     Your cardiac CT will be scheduled at one of the below locations:   [facility] [address] [place], [place] [address] [phone]    If scheduled at [facility], please arrive at the [facility] and Children's Entrance (Entrance C2) of [facility] 30 minutes prior to test start time. You can use the FREE valet parking offered at entrance C (encouraged to control the heart rate for the test)  Proceed to the [facility] Department (first floor) to check-in and test prep.  All radiology patients and guests should use entrance C2 at [facility], accessed from [facility], even though the hospital's physical address listed is [address]    Please follow these  instructions carefully (unless otherwise directed):  An IV will be required for this test and Nitroglycerin will be given.  Hold all erectile dysfunction medications at least 3 days (72 hrs) prior to test. (Ie viagra, cialis, sildenafil, tadalafil, etc)   On the Night Before the Test: Be sure to Drink plenty of water. Do not consume any caffeinated/decaffeinated beverages or chocolate 12 hours prior to your test. Do not take any antihistamines 12 hours prior to your test.  On the Day of the Test: Drink plenty of water until 1 hour prior to the test. Do not eat any food 1 hour prior to test. You may take your regular medications prior to the test.  Take metoprolol (Lopressor)one (1) tablet by mouth ( 50 mg)  two hours prior to test.      After the Test: Drink plenty of water. After receiving IV contrast, you may experience a mild flushed feeling. This is normal. On occasion, you may experience a mild rash up to 24 hours after the test. This is not dangerous. If this occurs, you can take Benadryl 25 mg, Zyrtec, Claritin, or Allegra and increase your fluid intake. (Patients taking Tikosyn should avoid Benadryl, and may take Zyrtec, Claritin, or Allegra) If you experience trouble breathing, this can be serious. If it is severe call 911 IMMEDIATELY. If it is mild, please call our office.  We will call to schedule your test 2-4 weeks out understanding that some insurance companies will need an authorization prior to the service being performed.  For more information and frequently asked questions, please visit our website : [email or web address]  For non-scheduling related questions, please contact the cardiac imaging nurse navigator should you have any questions/concerns: Cardiac Imaging Nurse Navigators Direct Office Dial: [phone]   For scheduling needs, including cancellations and rescheduling, please call [place], [phone].     Follow-Up: At [facility], you and your health needs are our priority.  As part of our continuing mission to provide you with exceptional heart care, our providers are all part of one team.  This team includes your primary Cardiologist (physician) and Advanced Practice Providers or APPs (Physician Assistants and Nurse Practitioners) who all work together to provide you with the care you need, when you need it.  Your next appointment:   3 month(s)  Provider:   Eligha Bridegroom, NP    We recommend signing up for the patient portal called "MyChart".  Sign up information is provided on this After Visit Summary.  MyChart is used to connect with patients for Virtual Visits (Telemedicine).  Patients are able to view lab/test results, encounter notes, upcoming appointments, etc.  Non-urgent messages can be sent to your provider as well.   To learn more about what you can do with MyChart, go to [email or web address].

## 2023-11-23 NOTE — Telephone Encounter (Signed)
 Called and spoke to pt. Carotid duplex scheduled for 4/21 @ 1:30p He verbalized understanding.

## 2023-12-06 ENCOUNTER — Telehealth (HOSPITAL_COMMUNITY): Payer: Self-pay | Admitting: Emergency Medicine

## 2023-12-06 ENCOUNTER — Telehealth (HOSPITAL_COMMUNITY): Payer: Self-pay | Admitting: *Deleted

## 2023-12-06 NOTE — Telephone Encounter (Signed)
 Attempted to call patient regarding upcoming cardiac CT appointment. Left message on voicemail with name and callback number  Larey Brick RN Navigator Cardiac Imaging [facility] [phone] Office [phone] Cell

## 2023-12-06 NOTE — Telephone Encounter (Signed)
 Reaching out to patient to offer assistance regarding upcoming cardiac imaging study; pt verbalizes understanding of appt date/time, parking situation and where to check in, pre-test NPO status and medications ordered, and verified current allergies; name and call back number provided for further questions should they arise Rockwell Alexandria RN Navigator Cardiac Imaging [facility] [phone] office [phone] cell

## 2023-12-07 ENCOUNTER — Ambulatory Visit (HOSPITAL_COMMUNITY)
Admission: RE | Admit: 2023-12-07 | Discharge: 2023-12-07 | Disposition: A | Discharge status: Home / Self Care | Source: Ambulatory Visit | Attending: Nurse Practitioner | Admitting: Nurse Practitioner

## 2023-12-07 DIAGNOSIS — I999 Unspecified disorder of circulatory system: Secondary | ICD-10-CM | POA: Insufficient documentation

## 2023-12-07 DIAGNOSIS — R931 Abnormal findings on diagnostic imaging of heart and coronary circulation: Secondary | ICD-10-CM | POA: Diagnosis present

## 2023-12-07 MED ORDER — IOHEXOL 350 MG/ML SOLN
95.0000 mL | Freq: Once | INTRAVENOUS | Status: AC | PRN
  Administered 2023-12-07: 95 mL via INTRAVENOUS

## 2023-12-07 MED ORDER — NITROGLYCERIN 0.4 MG SL SUBL
0.8000 mg | SUBLINGUAL_TABLET | Freq: Once | SUBLINGUAL | Status: AC
  Administered 2023-12-07: 0.8 mg via SUBLINGUAL

## 2023-12-07 MED ORDER — NITROGLYCERIN 0.4 MG SL SUBL
SUBLINGUAL_TABLET | SUBLINGUAL | Status: AC
  Filled 2023-12-07: qty 2

## 2023-12-08 ENCOUNTER — Ambulatory Visit (HOSPITAL_COMMUNITY)

## 2023-12-08 ENCOUNTER — Encounter (HOSPITAL_BASED_OUTPATIENT_CLINIC_OR_DEPARTMENT_OTHER): Payer: Self-pay

## 2023-12-09 ENCOUNTER — Ambulatory Visit (HOSPITAL_COMMUNITY)

## 2023-12-12 ENCOUNTER — Ambulatory Visit (INDEPENDENT_AMBULATORY_CARE_PROVIDER_SITE_OTHER)

## 2023-12-12 DIAGNOSIS — I2510 Atherosclerotic heart disease of native coronary artery without angina pectoris: Secondary | ICD-10-CM

## 2023-12-12 DIAGNOSIS — R931 Abnormal findings on diagnostic imaging of heart and coronary circulation: Secondary | ICD-10-CM

## 2023-12-12 DIAGNOSIS — I999 Unspecified disorder of circulatory system: Secondary | ICD-10-CM

## 2023-12-14 ENCOUNTER — Encounter (HOSPITAL_BASED_OUTPATIENT_CLINIC_OR_DEPARTMENT_OTHER): Payer: Self-pay

## 2023-12-20 ENCOUNTER — Encounter: Payer: Self-pay | Admitting: Family Medicine

## 2023-12-21 MED ORDER — TADALAFIL 5 MG PO TABS: ORAL_TABLET | ORAL | 1 refills | Status: DC

## 2024-02-16 ENCOUNTER — Encounter (HOSPITAL_BASED_OUTPATIENT_CLINIC_OR_DEPARTMENT_OTHER): Payer: Self-pay | Admitting: *Deleted

## 2024-03-05 ENCOUNTER — Ambulatory Visit (HOSPITAL_BASED_OUTPATIENT_CLINIC_OR_DEPARTMENT_OTHER): Admitting: Nurse Practitioner

## 2024-03-19 ENCOUNTER — Encounter: Payer: Self-pay | Admitting: Family Medicine

## 2024-03-20 ENCOUNTER — Other Ambulatory Visit (HOSPITAL_COMMUNITY)
Admission: RE | Admit: 2024-03-20 | Discharge: 2024-03-20 | Disposition: A | Discharge status: Home / Self Care | Source: Ambulatory Visit | Attending: Family Medicine | Admitting: Family Medicine

## 2024-03-20 ENCOUNTER — Ambulatory Visit (INDEPENDENT_AMBULATORY_CARE_PROVIDER_SITE_OTHER): Admitting: Family Medicine

## 2024-03-20 ENCOUNTER — Encounter: Payer: Self-pay | Admitting: Family Medicine

## 2024-03-20 ENCOUNTER — Ambulatory Visit: Payer: Self-pay | Admitting: Family Medicine

## 2024-03-20 VITALS — BP 112/80 | HR 86 | Temp 98.0°F | Wt 192.2 lb

## 2024-03-20 DIAGNOSIS — E785 Hyperlipidemia, unspecified: Secondary | ICD-10-CM

## 2024-03-20 DIAGNOSIS — H6123 Impacted cerumen, bilateral: Secondary | ICD-10-CM | POA: Diagnosis not present

## 2024-03-20 DIAGNOSIS — Z113 Encounter for screening for infections with a predominantly sexual mode of transmission: Secondary | ICD-10-CM | POA: Insufficient documentation

## 2024-03-20 DIAGNOSIS — Z0000 Encounter for general adult medical examination without abnormal findings: Secondary | ICD-10-CM | POA: Diagnosis not present

## 2024-03-20 LAB — COMPREHENSIVE METABOLIC PANEL WITH GFR
ALT: 16 U/L (ref 0–53)
AST: 18 U/L (ref 0–37)
Albumin: 4.4 g/dL (ref 3.5–5.2)
Alkaline Phosphatase: 63 U/L (ref 39–117)
BUN: 20 mg/dL (ref 6–23)
CO2: 27 meq/L (ref 19–32)
Calcium: 9.4 mg/dL (ref 8.4–10.5)
Chloride: 102 meq/L (ref 96–112)
Creatinine, Ser: 1.02 mg/dL (ref 0.40–1.50)
GFR: 75.9 mL/min (ref >60.00)
Glucose, Bld: 97 mg/dL (ref 70–99)
Potassium: 4.8 meq/L (ref 3.5–5.1)
Sodium: 138 meq/L (ref 135–145)
Total Bilirubin: 0.5 mg/dL (ref 0.2–1.2)
Total Protein: 6.2 g/dL (ref 6.0–8.3)

## 2024-03-20 LAB — CBC WITH DIFFERENTIAL/PLATELET
Basophils Absolute: 0 K/uL (ref 0.0–0.1)
Basophils Relative: 0.5 % (ref 0.0–3.0)
Eosinophils Absolute: 0.1 K/uL (ref 0.0–0.7)
Eosinophils Relative: 1.1 % (ref 0.0–5.0)
HCT: 44.8 % (ref 39.0–52.0)
Hemoglobin: 15 g/dL (ref 13.0–17.0)
Lymphocytes Relative: 17.4 % (ref 12.0–46.0)
Lymphs Abs: 0.9 K/uL (ref 0.7–4.0)
MCHC: 33.6 g/dL (ref 30.0–36.0)
MCV: 92.7 fl (ref 78.0–100.0)
Monocytes Absolute: 0.5 K/uL (ref 0.1–1.0)
Monocytes Relative: 9 % (ref 3.0–12.0)
Neutro Abs: 3.9 K/uL (ref 1.4–7.7)
Neutrophils Relative %: 72 % (ref 43.0–77.0)
Platelets: 209 K/uL (ref 150.0–400.0)
RBC: 4.83 Mil/uL (ref 4.22–5.81)
RDW: 13.7 % (ref 11.5–15.5)
WBC: 5.4 K/uL (ref 4.0–10.5)

## 2024-03-20 LAB — LIPID PANEL
Cholesterol: 221 mg/dL — ABNORMAL HIGH (ref 0–200)
HDL: 101.6 mg/dL (ref >39.00)
LDL Cholesterol: 110 mg/dL — ABNORMAL HIGH (ref 0–99)
NonHDL: 118.91
Total CHOL/HDL Ratio: 2
Triglycerides: 46 mg/dL (ref 0.0–149.0)
VLDL: 9.2 mg/dL (ref 0.0–40.0)

## 2024-03-20 LAB — HEPATIC FUNCTION PANEL
ALT: 16 U/L (ref 0–53)
AST: 18 U/L (ref 0–37)
Albumin: 4.4 g/dL (ref 3.5–5.2)
Alkaline Phosphatase: 63 U/L (ref 39–117)
Bilirubin, Direct: 0.1 mg/dL (ref 0.0–0.3)
Total Bilirubin: 0.5 mg/dL (ref 0.2–1.2)
Total Protein: 6.2 g/dL (ref 6.0–8.3)

## 2024-03-20 LAB — HEMOGLOBIN A1C: Hgb A1c MFr Bld: 5.5 % (ref 4.6–6.5)

## 2024-03-20 LAB — PSA, MEDICARE: PSA: 4.55 ng/mL — ABNORMAL HIGH (ref 0.10–4.00)

## 2024-03-20 LAB — TSH: TSH: 0.67 u[IU]/mL (ref 0.35–5.50)

## 2024-03-20 NOTE — Telephone Encounter (Signed)
 Please see result note

## 2024-03-20 NOTE — Progress Notes (Signed)
 Established Patient Office Visit  Subjective   Patient ID: Miguel Benjamin, male    DOB: August 13, 1956  Age: 68 y.o. MRN: [id number]  Chief Complaint  Patient presents with   Cerumen Impaction    HPI   Miguel Benjamin is here for bilateral ear fullness and also requesting some lab work as well.  He states he had cerumen impaction in the past and generally has to get these cleaned out about every 2 years.  Recently with swimming is felt like he had difficulty getting water  out.  He said some diminished hearing.  No ear pain.  No drainage.  He has a new relationship with someone this year and decided he wants to get STD screening at this point.  He has no history of STD and denies any dysuria, fever, rashes, or penile discharge.  He is also requesting labs today in preparation for upcoming physical.  He knows that his insurance may not cover if these are done in advance.  Past Medical History:  Diagnosis Date   Allergy    Arthritis    COPD (chronic obstructive pulmonary disease) (HCC)    Past Surgical History:  Procedure Laterality Date   HERNIA REPAIR     knee reconstruction on right knee     SHOULDER SURGERY     TONSILLECTOMY     TOTAL KNEE ARTHROPLASTY Left 08/01/2017   Procedure: LEFT TOTAL KNEE ARTHROPLASTY;  Surgeon: Melodi Lerner, MD;  Location: [facility];  Service: Orthopedics;  Laterality: Left;  Adductor Block   VASECTOMY Bilateral 2021    reports that he has quit smoking. He has never used smokeless tobacco. He reports current alcohol use. He reports that he does not use drugs. family history includes Cancer in his father and sister; Cardiomyopathy in his father; Hyperthyroidism in his mother; Lung cancer in his father. No Known Allergies  Review of Systems  Constitutional:  Negative for malaise/fatigue.  HENT:  Negative for ear discharge and ear pain.   Eyes:  Negative for blurred vision.  Respiratory:  Negative for shortness of breath.   Cardiovascular:  Negative for  chest pain.  Neurological:  Negative for dizziness, weakness and headaches.      Objective:     BP 112/80 (BP Location: Left Arm, Patient Position: Sitting, Cuff Size: Normal)   Pulse 86   Temp 98 F (36.7 C) (Oral)   Wt 192 lb 3.2 oz (87.2 kg)   SpO2 96%   BMI 26.07 kg/m  BP Readings from Last 3 Encounters:  03/20/24 112/80  12/07/23 109/77  11/23/23 126/80   Wt Readings from Last 3 Encounters:  03/20/24 192 lb 3.2 oz (87.2 kg)  11/23/23 197 lb 6.4 oz (89.5 kg)  09/02/23 194 lb (88 kg)      Physical Exam Constitutional:      Appearance: He is well-developed.  HENT:     Ears:     Comments: Bilateral cerumen impactions. Eyes:     Pupils: Pupils are equal, round, and reactive to light.  Neck:     Thyroid : No thyromegaly.  Cardiovascular:     Rate and Rhythm: Normal rate and regular rhythm.  Pulmonary:     Effort: Pulmonary effort is normal. No respiratory distress.     Breath sounds: Normal breath sounds. No wheezing or rales.  Musculoskeletal:     Cervical back: Neck supple.  Neurological:     Mental Status: He is alert and oriented to person, place, and time.  No results found for any visits on 03/20/24.    The 10-year ASCVD risk score (Arnett DK, et al., 2019) is: 10.8%    Assessment & Plan:   Problem List Items Addressed This Visit       Unprioritized   IMPACTED CERUMEN - Primary   Other Visit Diagnoses       Physical exam       Relevant Orders   CMP   CBC with Differential/Platelet   TSH   PSA, Medicare   Hemoglobin A1c   HIV Antibody (routine testing w rflx)   RPR   Hep B Surface Antigen     Screen for STD (sexually transmitted disease)       Relevant Orders   Cytology (oral, anal, urethral) ancillary only     Hyperlipidemia, unspecified hyperlipidemia type         - Impacted cerumen.  Patient aware of risk of irrigation including risk of pain, bleeding, low risk of eardrum perforation.  He consented.  Both canals were  cleared of cerumen and patient tolerated well  -Obtain labs as above in preparation for physical.  -Patient also requesting STD screening and this will be obtained today as well.  No follow-ups on file.    [name], MD

## 2024-03-21 LAB — HIV ANTIBODY (ROUTINE TESTING W REFLEX): HIV 1&2 Ab, 4th Generation: NONREACTIVE

## 2024-03-21 LAB — RPR: RPR Ser Ql: NONREACTIVE

## 2024-03-21 LAB — HEPATITIS B SURFACE ANTIGEN: Hepatitis B Surface Ag: NONREACTIVE

## 2024-03-21 LAB — CYTOLOGY, (ORAL, ANAL, URETHRAL) ANCILLARY ONLY
Chlamydia: NEGATIVE
Comment: NEGATIVE
Comment: NEGATIVE
Comment: NORMAL
Neisseria Gonorrhea: NEGATIVE
Trichomonas: NEGATIVE

## 2024-04-09 ENCOUNTER — Ambulatory Visit (INDEPENDENT_AMBULATORY_CARE_PROVIDER_SITE_OTHER): Admitting: Family Medicine

## 2024-04-09 VITALS — BP 120/68 | HR 99 | Temp 97.8°F | Ht 71.65 in | Wt 188.7 lb

## 2024-04-09 DIAGNOSIS — E785 Hyperlipidemia, unspecified: Secondary | ICD-10-CM | POA: Diagnosis not present

## 2024-04-09 DIAGNOSIS — Z0000 Encounter for general adult medical examination without abnormal findings: Secondary | ICD-10-CM

## 2024-04-09 DIAGNOSIS — Z1211 Encounter for screening for malignant neoplasm of colon: Secondary | ICD-10-CM | POA: Diagnosis not present

## 2024-04-09 DIAGNOSIS — R972 Elevated prostate specific antigen [PSA]: Secondary | ICD-10-CM

## 2024-04-09 NOTE — Progress Notes (Signed)
 Established Patient Office Visit  Subjective   Patient ID: Miguel Benjamin, male    DOB: [birth year]  Age: 68 y.o. MRN: [id number]  Chief Complaint  Patient presents with   Annual Exam    HPI   Miguel Benjamin is here for physical exam.  He has history of osteoarthritis, past history of nicotine  use, hyperlipidemia, elevated coronary calcium  score.  We had recommended atorvastatin  initially 20 mg daily but he had significant myalgias.  Was subsequently placed by cardiology on rosuvastatin  20 mg daily but has not yet started this.  He did have recent labs significant for PSA of 4.55.  This has been elevated in the past and somewhat up-and-down.  He had total cholesterol 221 with LDL 110.  A1c 5.5%.  Health maintenance reviewed.  He is overdue for colonoscopy.  He is requesting referral for that to be set up.  His previous gastroenterologist recently retired.  Tetanus up-to-date.  He has had previous shingles vaccine.  No documented pneumonia vaccine  Past Medical History:  Diagnosis Date   Allergy    Arthritis    COPD (chronic obstructive pulmonary disease) (HCC)    Past Surgical History:  Procedure Laterality Date   HERNIA REPAIR     knee reconstruction on right knee     SHOULDER SURGERY     TONSILLECTOMY     TOTAL KNEE ARTHROPLASTY Left 08/01/2017   Procedure: LEFT TOTAL KNEE ARTHROPLASTY;  Surgeon: Melodi Lerner, MD;  Location: [facility];  Service: Orthopedics;  Laterality: Left;  Adductor Block   VASECTOMY Bilateral 2021    reports that he has quit smoking. He has never used smokeless tobacco. He reports current alcohol use. He reports that he does not use drugs. family history includes Cancer in his father and sister; Cardiomyopathy in his father; Hyperthyroidism in his mother; Lung cancer in his father. No Known Allergies  Social history-divorced.  He does have 2 children.  No grandchildren.  Quit smoking about 5 years ago.  Does drink alcohol sometimes 2-3 alcoholic beverages per  day.  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  Enjoys golf.  Has had to give up tennis because of some knee issues but does play pickle ball.  Family history-mother had hyperthyroidism.  Father reportedly had lung cancer and bladder cancer.  He was a smoker.  Sister with some type of brain cancer.  This was operable and she is still alive.  He is not sure which type.  Review of Systems  Constitutional:  Negative for chills, fever, malaise/fatigue and weight loss.  HENT:  Negative for hearing loss.   Eyes:  Negative for blurred vision and double vision.  Respiratory:  Negative for cough and shortness of breath.   Cardiovascular:  Negative for chest pain, palpitations and leg swelling.  Gastrointestinal:  Negative for abdominal pain, blood in stool, constipation and diarrhea.  Genitourinary:  Negative for dysuria.  Skin:  Negative for rash.  Neurological:  Negative for dizziness, speech change, seizures, loss of consciousness and headaches.  Psychiatric/Behavioral:  Negative for depression.       Objective:     BP 120/68   Pulse 99   Temp 97.8 F (36.6 C) (Oral)   Ht 5' 11.65 (1.82 m)   Wt 188 lb 11.2 oz (85.6 kg)   SpO2 96%   BMI 25.84 kg/m  BP Readings from Last 3 Encounters:  04/09/24 120/68  03/20/24 112/80  12/07/23 109/77   Wt Readings from Last 3 Encounters:  04/09/24  188 lb 11.2 oz (85.6 kg)  03/20/24 192 lb 3.2 oz (87.2 kg)  11/23/23 197 lb 6.4 oz (89.5 kg)      Physical Exam Vitals reviewed.  Constitutional:      General: He is not in acute distress.    Appearance: He is well-developed. He is not ill-appearing.  HENT:     Right Ear: External ear normal.     Left Ear: External ear normal.  Eyes:     Pupils: Pupils are equal, round, and reactive to light.  Neck:     Thyroid : No thyromegaly.     Comments: Soft lipomatous mass just posterior to the TMJ joint region near the lower earlobe region Cardiovascular:     Rate and Rhythm: Normal rate and  regular rhythm.  Pulmonary:     Effort: Pulmonary effort is normal. No respiratory distress.     Breath sounds: Normal breath sounds. No wheezing or rales.  Abdominal:     Comments: Normal bowel sounds.  Nondistended.  Small umbilical hernia which is soft and nontender.  No other masses palpated.  Musculoskeletal:     Cervical back: Neck supple.     Right lower leg: No edema.     Left lower leg: No edema.  Neurological:     General: No focal deficit present.     Mental Status: He is alert and oriented to person, place, and time.     Cranial Nerves: No cranial nerve deficit.      No results found for any visits on 04/09/24.  Last CBC Lab Results  Component Value Date   WBC 5.4 03/20/2024   HGB 15.0 03/20/2024   HCT 44.8 03/20/2024   MCV 92.7 03/20/2024   MCH 31.2 05/20/2022   RDW 13.7 03/20/2024   PLT 209.0 03/20/2024   Last metabolic panel Lab Results  Component Value Date   GLUCOSE 97 03/20/2024   NA 138 03/20/2024   K 4.8 03/20/2024   CL 102 03/20/2024   CO2 27 03/20/2024   BUN 20 03/20/2024   CREATININE 1.02 03/20/2024   GFR 75.90 03/20/2024   CALCIUM  9.4 03/20/2024   PROT 6.2 03/20/2024   PROT 6.2 03/20/2024   ALBUMIN 4.4 03/20/2024   ALBUMIN 4.4 03/20/2024   BILITOT 0.5 03/20/2024   BILITOT 0.5 03/20/2024   ALKPHOS 63 03/20/2024   ALKPHOS 63 03/20/2024   AST 18 03/20/2024   AST 18 03/20/2024   ALT 16 03/20/2024   ALT 16 03/20/2024   ANIONGAP 9 05/20/2022   Last lipids Lab Results  Component Value Date   CHOL 221 (H) 03/20/2024   HDL 101.60 03/20/2024   LDLCALC 110 (H) 03/20/2024   LDLDIRECT 133.4 07/11/2012   TRIG 46.0 03/20/2024   CHOLHDL 2 03/20/2024   Last hemoglobin A1c Lab Results  Component Value Date   HGBA1C 5.5 03/20/2024   Last thyroid  functions Lab Results  Component Value Date   TSH 0.67 03/20/2024      The ASCVD Risk score (Arnett DK, et al., 2019) failed to calculate for the following reasons:   The valid HDL  cholesterol range is 20 to 100 mg/dL    Assessment & Plan:   Physical exam.  He has chronic problems as above including history of osteoarthritis, hyperlipidemia, elevated PSA, high coronary calcium  score.  We discussed the following health maintenance items  - Recommend annual flu vaccine -Needs pneumonia vaccine -Strongly recommend he start rosuvastatin  now and future lab order placed for lipid and hepatic panel within  a few months -Also recommend repeat PSA in about 3 to 4 months and if climbing further at that point consider urology referral -Referral placed for repeat colonoscopy which is overdue  No follow-ups on file.    [name], MD

## 2024-04-09 NOTE — Patient Instructions (Signed)
 Set up repeat PSA in about 4 months.

## 2024-05-03 ENCOUNTER — Encounter: Payer: Self-pay | Admitting: Family Medicine

## 2024-05-09 ENCOUNTER — Ambulatory Visit (INDEPENDENT_AMBULATORY_CARE_PROVIDER_SITE_OTHER): Admitting: Family Medicine

## 2024-05-09 ENCOUNTER — Encounter: Payer: Self-pay | Admitting: Family Medicine

## 2024-05-09 ENCOUNTER — Ambulatory Visit: Payer: Self-pay | Admitting: Family Medicine

## 2024-05-09 ENCOUNTER — Other Ambulatory Visit (HOSPITAL_COMMUNITY)
Admission: RE | Admit: 2024-05-09 | Discharge: 2024-05-09 | Disposition: A | Discharge status: Home / Self Care | Source: Ambulatory Visit | Attending: Family Medicine | Admitting: Family Medicine

## 2024-05-09 VITALS — BP 138/87 | HR 76 | Temp 97.3°F | Wt 198.6 lb

## 2024-05-09 DIAGNOSIS — R251 Tremor, unspecified: Secondary | ICD-10-CM | POA: Diagnosis not present

## 2024-05-09 DIAGNOSIS — R030 Elevated blood-pressure reading, without diagnosis of hypertension: Secondary | ICD-10-CM

## 2024-05-09 DIAGNOSIS — R972 Elevated prostate specific antigen [PSA]: Secondary | ICD-10-CM

## 2024-05-09 DIAGNOSIS — R809 Proteinuria, unspecified: Secondary | ICD-10-CM | POA: Diagnosis not present

## 2024-05-09 DIAGNOSIS — Z113 Encounter for screening for infections with a predominantly sexual mode of transmission: Secondary | ICD-10-CM | POA: Diagnosis present

## 2024-05-09 DIAGNOSIS — E785 Hyperlipidemia, unspecified: Secondary | ICD-10-CM

## 2024-05-09 DIAGNOSIS — R239 Unspecified skin changes: Secondary | ICD-10-CM

## 2024-05-09 LAB — POC URINALSYSI DIPSTICK (AUTOMATED)
Bilirubin, UA: NEGATIVE
Blood, UA: NEGATIVE
Glucose, UA: NEGATIVE
Ketones, UA: NEGATIVE
Leukocytes, UA: NEGATIVE
Nitrite, UA: NEGATIVE
Protein, UA: POSITIVE — AB
Spec Grav, UA: >=1.03 — AB (ref 1.010–1.025)
Urobilinogen, UA: 0.2 U/dL
pH, UA: 6 (ref 5.0–8.0)

## 2024-05-09 LAB — MICROALBUMIN / CREATININE URINE RATIO
Creatinine,U: 171.4 mg/dL
Microalb Creat Ratio: 19 mg/g (ref 0.0–30.0)
Microalb, Ur: 3.3 mg/dL — ABNORMAL HIGH (ref 0.0–1.9)

## 2024-05-09 NOTE — Patient Instructions (Addendum)
 A few things to remember from today's visit:  Elevated PSA - Plan: POCT Urinalysis Dipstick (Automated), Urine Culture  Tremor  Hyperlipidemia, unspecified hyperlipidemia type  Proteinuria, unspecified type - Plan: Microalbumin / creatinine urine ratio, Urine Culture  Screen for STD (sexually transmitted disease) - Plan: Urine cytology ancillary only  Unspecified skin changes - Plan: Ambulatory referral to Dermatology  Do not use My Chart to request refills or for acute issues that need immediate attention. If you send a my chart message, it may take a few days to be addressed, specially if I am not in the office.  Please be sure medication list is accurate. If a new problem present, please set up appointment sooner than planned today. This is the message for colonoscopy.  Appointment Scheduling:  We recognize that you are likely looking forward to scheduling your appointment.  Your referral has been sent to the appropriate office for scheduling. Usually, it takes 3-5 business days to process referrals once they are received.  Please allow this time for the office handling your referral to contact you directly to arrange your appointment.  If you have not been contacted within 5 business days, please call [phone] to make an appointment.  Continue statin for cholesterol. Urine culture ordered today. Monitor for urinary symptoms. Decrease alcohol intake. Moniotr blood pressure at home.

## 2024-05-09 NOTE — Progress Notes (Signed)
 ACUTE VISIT Chief Complaint  Patient presents with   abnormal results     Denies any Sx, has results from [facility]; see attached lab in 09/11 message   Discussed the use of AI scribe software for clinical note transcription with the patient, who gave verbal consent to proceed.  History of Present Illness Miguel Benjamin is a 68 year old male with PMHx of COPD and OA who presents with concerns about recent urinalysis results and cardiovascular health following his brother's sudden death. PCP Dr Micheal.  He is concerned about recent urinalysis results showing a few bacteria.Rest of UA otherwise normal. He occasionally experiences a mild transient itch with urination. He usually urinates twice a night, which has been stable for a long time. He denies burning or drainage. He is sexually active and was tested for STDs in July, with no risk factors reported since then.  He is undergoing extensive health testing due to his younger brother's recent death from a massive heart attack. His brother was diabetic and had significant coronary artery blockages. He is proactive about his health, regularly exercising and trying to follow a healthful diet. He recently started a statin medication for HLD.  Lab Results  Component Value Date   CHOL 221 (H) 03/20/2024   HDL 101.60 03/20/2024   LDLCALC 110 (H) 03/20/2024   LDLDIRECT 133.4 07/11/2012   TRIG 46.0 03/20/2024   CHOLHDL 2 03/20/2024   His family history includes his father who died at 106 from multiple health issues including bladder and lung cancer, and heart issues. His mother, aged 58, is healthy despite a history of thyroid  cancer. She has been hospitalized for a fall. His maternal grandmother lived to 69. There is a limited family history on the male side due to deaths in wars.  He drinks one to two glasses of wine nightly. He has a history of knee and shoulder surgeries due to sports injuries.   Noted mild tremor hand and head  tremor, which he reports is intermittent and has had it for a while. BP today mildly elevated, no Hx of HTN, does not check BP regularly. He attributes reading to exercise earlier today.  He has not undergone colon cancer screening. Recent PSA mildly elevated. He had PSA 05/01/24, elevated at 6.1. States that more GU testing is pending, he is not concerned about number. Negative for fever,chills, mylagias, or rectal pain. No changes in urinary frequency or gross hematuria.  Lab Results  Component Value Date   PSA 4.55 (H) 03/20/2024   PSA 2.89 11/24/2020   PSA 3.70 06/26/2019   He is also requesting dermatology referral. No Fhx or personal hx of skin cancer. Has not seen lesions with changes.  Review of Systems  Constitutional:  Negative for activity change, appetite change and unexpected weight change.  HENT:  Negative for mouth sores and sore throat.   Respiratory:  Negative for cough, shortness of breath and wheezing.   Cardiovascular:  Negative for chest pain, palpitations and leg swelling.  Gastrointestinal:  Negative for abdominal pain, nausea and vomiting.  Genitourinary:  Negative for genital sores, penile discharge and scrotal swelling.  Musculoskeletal:  Positive for arthralgias.  Skin:  Negative for rash.  Neurological:  Negative for syncope and facial asymmetry.  Psychiatric/Behavioral:  Negative for confusion and hallucinations.   See other pertinent positives and negatives in HPI.  Current Outpatient Medications on File Prior to Visit  Medication Sig Dispense Refill   Cholecalciferol (VITAMIN D) 2000 units CAPS Take  by mouth.     cyanocobalamin  500 MCG tablet Take 500 mcg by mouth daily.     Glucosamine-Chondroit-Vit C-Mn (GLUCOSAMINE 1500 COMPLEX PO) Take by mouth.     Naproxen Sodium (ALEVE PO) Aleve     Omega-3 Fatty Acids (FISH OIL) 1000 MG CAPS Take by mouth.     rosuvastatin  (CRESTOR ) 20 MG tablet Take 1 tablet (20 mg total) by mouth daily. 90 tablet 3    tadalafil  (CIALIS ) 5 MG tablet Take once daily as needed for erectile dysfunction 90 tablet 1   tamsulosin  (FLOMAX ) 0.4 MG CAPS capsule Take 1 capsule (0.4 mg total) by mouth daily. (Patient not taking: Reported on 04/09/2024) 30 capsule 3   triamcinolone  cream (KENALOG ) 0.1 % Apply to area once daily for couple of weeks. (Patient not taking: Reported on 04/09/2024) 15 g 0   No current facility-administered medications on file prior to visit.    Past Medical History:  Diagnosis Date   Allergy    Arthritis    COPD (chronic obstructive pulmonary disease) (HCC)    No Known Allergies  Social History   Socioeconomic History   Marital status: Divorced    Spouse name: Not on file   Number of children: Not on file   Years of education: Not on file   Highest education level: Associate degree: academic program  Occupational History   Not on file  Tobacco Use   Smoking status: Former    Current packs/day: 0.00    Types: Cigarettes   Smokeless tobacco: Never   Tobacco comments:    Also use nicotine  patch  Vaping Use   Vaping status: Some Days  Substance and Sexual Activity   Alcohol use: Yes    Comment: one drink per night   Drug use: No   Sexual activity: Yes    Birth control/protection: None  Other Topics Concern   Not on file  Social History Narrative   Not on file   Social Drivers of Health   Financial Resource Strain: Low Risk  (04/09/2024)   Overall Financial Resource Strain (CARDIA)    Difficulty of Paying Living Expenses: Not hard at all  Food Insecurity: No Food Insecurity (04/09/2024)   Hunger Vital Sign    Worried About Running Out of Food in the Last Year: Never true    Ran Out of Food in the Last Year: Never true  Transportation Needs: No Transportation Needs (04/09/2024)   PRAPARE - Administrator, Civil Service (Medical): No    Lack of Transportation (Non-Medical): No  Physical Activity: Sufficiently Active (04/09/2024)   Exercise Vital Sign    Days  of Exercise per Week: 7 days    Minutes of Exercise per Session: 50 min  Stress: No Stress Concern Present (04/09/2024)   [employer] of Occupational Health - Occupational Stress Questionnaire    Feeling of Stress: Not at all  Social Connections: Unknown (04/09/2024)   Social Connection and Isolation Panel    Frequency of Communication with Friends and Family: More than three times a week    Frequency of Social Gatherings with Friends and Family: Three times a week    Attends Religious Services: Patient declined    Active Member of Clubs or Organizations: Patient declined    Attends Banker Meetings: Not on file    Marital Status: Divorced    Vitals:   05/09/24 0823  BP: 138/87  Pulse: 76  Temp: (!) 97.3 F (36.3 C)  SpO2: 98%  Body mass index is 27.2 kg/m.  Physical Exam Vitals and nursing note reviewed.  Constitutional:      General: He is not in acute distress.    Appearance: He is well-developed.  HENT:     Head: Normocephalic and atraumatic.     Mouth/Throat:     Mouth: Mucous membranes are moist.     Pharynx: Oropharynx is clear.  Eyes:     Conjunctiva/sclera: Conjunctivae normal.  Cardiovascular:     Rate and Rhythm: Normal rate and regular rhythm.     Pulses:          Dorsalis pedis pulses are 2+ on the right side and 2+ on the left side.     Heart sounds: No murmur heard. Pulmonary:     Effort: Pulmonary effort is normal. No respiratory distress.     Breath sounds: Normal breath sounds.  Abdominal:     Palpations: Abdomen is soft. There is no hepatomegaly or mass.     Tenderness: There is no abdominal tenderness.  Lymphadenopathy:     Cervical: No cervical adenopathy.  Skin:    General: Skin is warm.     Findings: No erythema or rash.  Neurological:     Mental Status: He is alert and oriented to person, place, and time.     Cranial Nerves: No cranial nerve deficit.     Motor: Tremor (Head and hands L>R) present.     Gait: Gait  normal.  Psychiatric:        Mood and Affect: Mood and affect normal.    ASSESSMENT AND PLAN:  Mr. Heacock was seen today for concerns about recent lab results.  Orders Placed This Encounter  Procedures   Urine Culture   Microalbumin / creatinine urine ratio   Ambulatory referral to Dermatology   POCT Urinalysis Dipstick (Automated)   Lab Results  Component Value Date   MICROALBUR 3.3 (H) 05/09/2024   Elevated PSA States that more labs are pending. It has been elevated since 02/2024. No symptoms that suggest acute prostatitis.  Will wait for Ucx for further recommendations. May need urologic consultation.  -     POCT Urinalysis Dipstick (Automated) -     Urine Culture; Future  Tremor Mild. We discussed possible etiologies, not present at rest. ? Essential tremor. Recommend decreasing alcohol intake. Continue monitoring for changes.  Hyperlipidemia, unspecified hyperlipidemia type Recently started Rosuvastatin  20 mg daily. Continue low fat diet.  Proteinuria, unspecified type Urine dipstick today negative except for positive protein. We discussed possible causes, most likely benign. Microalb/Cr ratio ordered.  -     Microalbumin / creatinine urine ratio; Future -     Urine Culture; Future  Screen for STD (sexually transmitted disease) -     Urine cytology ancillary only  Unspecified skin changes -     Ambulatory referral to Dermatology  Elevated blood pressure reading Mildly elevated today. We discussed possible complications of elevated BP and recommendations about goal for CV prevention. Recommend monitoring BP at home regularly.  In regard to CVD prevention we discussed current recommendations. Coronary calcium  score 309. He is on Rosuvastatin . BP ideally 120/70's. Decrease alcohol intake. Continue regular physical activity and a healthful diet.  In regard to cancer screening, pending colonoscopy. GI information provided for him to call.  I  personally spent a total of 46 minutes in the care of the patient today including preparing to see the patient, getting/reviewing separately obtained history, performing a medically appropriate exam/evaluation, counseling and educating, placing  orders, documenting clinical information in the EHR, and independently interpreting results.  Return if symptoms worsen or fail to improve.  Chiyo Fay G. [place], MD  [facility]. [place] office.

## 2024-05-10 LAB — URINE CYTOLOGY ANCILLARY ONLY
Chlamydia: NEGATIVE
Comment: NEGATIVE
Comment: NEGATIVE
Comment: NORMAL
Neisseria Gonorrhea: NEGATIVE
Trichomonas: NEGATIVE

## 2024-05-14 LAB — URINE CULTURE: MICRO NUMBER:: 16980080

## 2024-05-16 ENCOUNTER — Other Ambulatory Visit: Payer: Self-pay | Admitting: Family Medicine

## 2024-05-16 DIAGNOSIS — R972 Elevated prostate specific antigen [PSA]: Secondary | ICD-10-CM

## 2024-06-02 ENCOUNTER — Encounter (HOSPITAL_BASED_OUTPATIENT_CLINIC_OR_DEPARTMENT_OTHER): Payer: Self-pay

## 2024-07-03 ENCOUNTER — Ambulatory Visit (AMBULATORY_SURGERY_CENTER)

## 2024-07-03 VITALS — Ht 71.5 in | Wt 198.2 lb

## 2024-07-03 DIAGNOSIS — Z1211 Encounter for screening for malignant neoplasm of colon: Secondary | ICD-10-CM

## 2024-07-03 MED ORDER — NA SULFATE-K SULFATE-MG SULF 17.5-3.13-1.6 GM/177ML PO SOLN: 1.0000 | Freq: Once | ORAL | 0 refills | Status: AC

## 2024-07-03 NOTE — Progress Notes (Signed)
 PCP MD at time of PV: [name], MD (primary) has seen Miguel Jordan, MD  __________________________________________________________________________________________________________________________________________  No egg allergy known to patient  No soy allergy known to patient No issues known to pt with past sedation with any surgeries or procedures Patient denies ever being told they had issues or difficulty with intubation  No FH of Malignant Hyperthermia Pt is not on diet pills Pt is not on  home 02  Pt is not on blood thinners  No A fib or A flutter Have any cardiac testing pending--no  LOA: independent  No Chew or Snuff tobacco __________________________________________________________________________________________________________________________________________  Constipation: no  Prep: suprep  __________________________________________________________________________________________________________________________________________  PV completed with patient. Prep instructions reviewed and provided during apt. Rx sent to preferred pharmacy.  __________________________________________________________________________________________________________________________________________  Patient's chart reviewed by Norleen Schillings CNRA prior to previsit and patient appropriate for the [facility].  Previsit completed and red dot placed by patient's name on their procedure day (on provider's schedule).

## 2024-07-17 ENCOUNTER — Encounter: Payer: Self-pay | Admitting: Gastroenterology

## 2024-07-26 ENCOUNTER — Encounter: Payer: Self-pay | Admitting: Gastroenterology

## 2024-07-26 ENCOUNTER — Ambulatory Visit: Admitting: Gastroenterology

## 2024-07-26 VITALS — BP 112/68 | HR 76 | Temp 97.3°F | Resp 13 | Ht 71.5 in | Wt 198.0 lb

## 2024-07-26 DIAGNOSIS — D123 Benign neoplasm of transverse colon: Secondary | ICD-10-CM

## 2024-07-26 DIAGNOSIS — K5730 Diverticulosis of large intestine without perforation or abscess without bleeding: Secondary | ICD-10-CM

## 2024-07-26 DIAGNOSIS — Z1211 Encounter for screening for malignant neoplasm of colon: Secondary | ICD-10-CM

## 2024-07-26 DIAGNOSIS — D125 Benign neoplasm of sigmoid colon: Secondary | ICD-10-CM | POA: Diagnosis not present

## 2024-07-26 DIAGNOSIS — K589 Irritable bowel syndrome without diarrhea: Secondary | ICD-10-CM

## 2024-07-26 DIAGNOSIS — Z860101 Personal history of adenomatous and serrated colon polyps: Secondary | ICD-10-CM

## 2024-07-26 DIAGNOSIS — K635 Polyp of colon: Secondary | ICD-10-CM | POA: Diagnosis not present

## 2024-07-26 DIAGNOSIS — D122 Benign neoplasm of ascending colon: Secondary | ICD-10-CM

## 2024-07-26 DIAGNOSIS — K648 Other hemorrhoids: Secondary | ICD-10-CM

## 2024-07-26 MED ORDER — SODIUM CHLORIDE 0.9 % IV SOLN: 500.0000 mL | Freq: Once | INTRAVENOUS | Status: DC

## 2024-07-26 NOTE — Progress Notes (Signed)
 Called to room to assist during endoscopic procedure.  Patient ID and intended procedure confirmed with present staff. Received instructions for my participation in the procedure from the performing physician.

## 2024-07-26 NOTE — Op Note (Signed)
 [facility] Patient Name: Miguel Benjamin Procedure Date: 07/26/2024 7:32 AM MRN: [id number] Endoscopist: Victory L. Legrand , MD, [id number] Age: 68 Referring MD:  Date of Birth: 01-28-1956 Gender: Male Account #: [id number] Procedure:                Colonoscopy Indications:              Screening for colorectal malignant neoplasm                           No polyps last colonoscopy December 2013                           At least 1 tubular adenoma in 2007 Medicines:                Monitored Anesthesia Care Procedure:                Pre-Anesthesia Assessment:                           - Prior to the procedure, a History and Physical                            was performed, and patient medications and                            allergies were reviewed. The patient's tolerance of                            previous anesthesia was also reviewed. The risks                            and benefits of the procedure and the sedation                            options and risks were discussed with the patient.                            All questions were answered, and informed consent                            was obtained. Prior Anticoagulants: The patient has                            taken no anticoagulant or antiplatelet agents. ASA                            Grade Assessment: II - A patient with mild systemic                            disease. After reviewing the risks and benefits,                            the patient was deemed in satisfactory condition to  undergo the procedure.                           After obtaining informed consent, the colonoscope                            was passed under direct vision. Throughout the                            procedure, the patient's blood pressure, pulse, and                            oxygen saturations were monitored continuously. The                            Olympus Scope SN [id number] was introduced  through the                            anus and advanced to the the cecum, identified by                            appendiceal orifice and ileocecal valve. The                            colonoscopy was somewhat difficult (see below). The                            patient tolerated the procedure well. The quality                            of the bowel preparation was adequate after lavage                            of dark opaque liquid, primarily in the right                            colon. The ileocecal valve, appendiceal orifice,                            and rectum were photographed. The bowel preparation                            used was SUPREP. Scope In: 8:38:25 AM Scope Out: 9:05:52 AM Scope Withdrawal Time: 0 hours 25 minutes 17 seconds  Total Procedure Duration: 0 hours 27 minutes 27 seconds  Findings:                 The perianal and digital rectal examinations were                            normal.                           Repeat examination of right colon under NBI  performed.                           Four sessile polyps were found in the transverse                            colon and ascending colon. The polyps were                            diminutive in size. These polyps were removed with                            a cold snare. Resection and retrieval were complete.                           Multiple diverticula were found in the left colon.                           A 12 mm polyp was found in the sigmoid colon. The                            polyp was pedunculated. Visualization somewhat                            challenging due to diverticulosis and colon spasm.                            The polyp was removed with a hot snare. Resection                            and retrieval were complete. There was post                            polypectomy oozing from the site. For hemostasis,                            one hemostatic clip  was successfully placed (MR                            conditional). There was no bleeding after that                            intervention and at the end of the procedure. For                            additional treatment, area of polyp base was                            successfully injected with 2 mL of a 0.01 mg/mL                            solution of epinephrine for hemostasis.  Internal hemorrhoids were found. The hemorrhoids                            were small.                           The exam was otherwise without abnormality on                            direct and retroflexion views. Complications:            No immediate complications. Estimated Blood Loss:     Estimated blood loss was minimal. Impression:               - Four diminutive polyps in the transverse colon                            and in the ascending colon, removed with a cold                            snare. Resected and retrieved.                           - Diverticulosis in the left colon.                           - One 12 mm polyp in the sigmoid colon, removed                            with a hot snare. Resected and retrieved. Clip (MR                            conditional) was placed. Injected.                           - Internal hemorrhoids.                           - The examination was otherwise normal on direct                            and retroflexion views. Recommendation:           - Patient has a contact number available for                            emergencies. The signs and symptoms of potential                            delayed complications were discussed with the                            patient. Return to normal activities tomorrow.                            Written discharge instructions were provided to the  patient.                           - Resume previous diet.                           - Continue present  medications.                           - Await pathology results.                           - Repeat colonoscopy is recommended for                            surveillance. The colonoscopy date will be                            determined after pathology results from today's                            exam become available for review. Mercede Rollo L. Legrand, MD 07/26/2024 9:13:36 AM This report has been signed electronically.

## 2024-07-26 NOTE — Patient Instructions (Signed)

## 2024-07-26 NOTE — Progress Notes (Signed)
 Report to PACU, RN, vss, BBS= Clear.

## 2024-07-26 NOTE — Progress Notes (Signed)
 Pt's states no medical or surgical changes since previsit or office visit.

## 2024-07-26 NOTE — Progress Notes (Signed)
 History and Physical:  This patient presents for endoscopic testing for: Encounter Diagnosis  Name Primary?   Special screening for malignant neoplasms, colon Yes    68 year old man here today for screening colonoscopy.  No polyps on last colonoscopy December 2013 by Dr. Aneita.  He reportedly had at least 1 tubular adenoma in 2007. Patient denies chronic abdominal pain, rectal bleeding, constipation or diarrhea.  Patient is otherwise without complaints or active issues today.   Past Medical History: Past Medical History:  Diagnosis Date   Allergy    Arthritis    COPD (chronic obstructive pulmonary disease) (HCC)      Past Surgical History: Past Surgical History:  Procedure Laterality Date   HERNIA REPAIR     knee reconstruction on right knee     SHOULDER SURGERY     TONSILLECTOMY     TOTAL KNEE ARTHROPLASTY Left 08/01/2017   Procedure: LEFT TOTAL KNEE ARTHROPLASTY;  Surgeon: Melodi Lerner, MD;  Location: [facility];  Service: Orthopedics;  Laterality: Left;  Adductor Block   VASECTOMY Bilateral 2021    Allergies: No Known Allergies  Outpatient Meds: Current Outpatient Medications  Medication Sig Dispense Refill   Cholecalciferol (VITAMIN D) 2000 units CAPS Take by mouth.     cyanocobalamin  500 MCG tablet Take 500 mcg by mouth daily.     Glucosamine-Chondroit-Vit C-Mn (GLUCOSAMINE 1500 COMPLEX PO) Take by mouth.     Naproxen Sodium (ALEVE PO) Aleve     Omega-3 Fatty Acids (FISH OIL) 1000 MG CAPS Take by mouth.     rosuvastatin  (CRESTOR ) 20 MG tablet Take 1 tablet (20 mg total) by mouth daily. 90 tablet 3   tadalafil  (CIALIS ) 5 MG tablet Take once daily as needed for erectile dysfunction (Patient not taking: Reported on 07/26/2024) 90 tablet 1   tamsulosin  (FLOMAX ) 0.4 MG CAPS capsule Take 1 capsule (0.4 mg total) by mouth daily. (Patient not taking: Reported on 11/23/2023) 30 capsule 3   triamcinolone  cream (KENALOG ) 0.1 % Apply to area once daily for couple of weeks.  (Patient not taking: No sig reported) 15 g 0   Current Facility-Administered Medications  Medication Dose Route Frequency Provider Last Rate Last Admin   0.9 %  sodium chloride  infusion  500 mL Intravenous Once Danis, Victory CROME III, MD          ___________________________________________________________________ Objective   Exam:  BP 125/67   Pulse (!) 102   Temp (!) 97.3 F (36.3 C)   Ht 5' 11.5 (1.816 m)   Wt 198 lb (89.8 kg)   SpO2 95%   BMI 27.23 kg/m   CV: regular , S1/S2 Resp: clear to auscultation bilaterally, normal RR and effort noted GI: soft, no tenderness, with active bowel sounds.   Assessment: Encounter Diagnosis  Name Primary?   Special screening for malignant neoplasms, colon Yes     Plan: Colonoscopy   The benefits and risks of the planned procedure(s) were described in detail with the patient or (when appropriate) their health care proxy.  Risks were outlined as including, but not limited to, bleeding, infection, perforation, adverse medication reaction leading to cardiac or pulmonary decompensation, pancreatitis (if ERCP).  The limitation of incomplete mucosal visualization was also discussed.  No guarantees or warranties were given.  The patient was provided an opportunity to ask questions and all were answered. The patient agreed with the plan.   The patient is appropriate for an endoscopic procedure in the ambulatory setting.   - Victory Brand, MD

## 2024-07-27 ENCOUNTER — Telehealth: Payer: Self-pay

## 2024-07-27 NOTE — Telephone Encounter (Signed)
  Follow up Call-     07/26/2024    7:44 AM  Call back number  Post procedure Call Back phone  # [phone]  Permission to leave phone message Yes     Patient questions:  Do you have a fever, pain , or abdominal swelling? No. Pain Score  0 *  Have you tolerated food without any problems? Yes.    Have you been able to return to your normal activities? Yes.    Do you have any questions about your discharge instructions: Diet   No. Medications  No. Follow up visit  No.  Do you have questions or concerns about your Care? No.  Actions: * If pain score is 4 or above: No action needed, pain <4.

## 2024-07-28 ENCOUNTER — Encounter (HOSPITAL_BASED_OUTPATIENT_CLINIC_OR_DEPARTMENT_OTHER): Payer: Self-pay

## 2024-07-28 DIAGNOSIS — I2510 Atherosclerotic heart disease of native coronary artery without angina pectoris: Secondary | ICD-10-CM

## 2024-07-28 DIAGNOSIS — E785 Hyperlipidemia, unspecified: Secondary | ICD-10-CM

## 2024-07-28 DIAGNOSIS — R931 Abnormal findings on diagnostic imaging of heart and coronary circulation: Secondary | ICD-10-CM

## 2024-07-30 LAB — SURGICAL PATHOLOGY

## 2024-07-30 NOTE — Addendum Note (Signed)
 Addended by: GLADIS PORTER HERO on: 07/30/2024 08:36 AM   Modules accepted: Orders

## 2024-07-31 ENCOUNTER — Ambulatory Visit: Payer: Self-pay | Admitting: Gastroenterology

## 2024-07-31 ENCOUNTER — Encounter: Payer: Self-pay | Admitting: Gastroenterology

## 2024-08-01 ENCOUNTER — Ambulatory Visit (HOSPITAL_BASED_OUTPATIENT_CLINIC_OR_DEPARTMENT_OTHER): Payer: Self-pay | Admitting: Nurse Practitioner

## 2024-08-01 LAB — HEPATIC FUNCTION PANEL
ALT: 27 IU/L (ref 0–44)
AST: 25 IU/L (ref 0–40)
Albumin: 4.6 g/dL (ref 3.9–4.9)
Alkaline Phosphatase: 67 IU/L (ref 47–123)
Bilirubin Total: 0.5 mg/dL (ref 0.0–1.2)
Bilirubin, Direct: 0.17 mg/dL (ref 0.00–0.40)
Total Protein: 6.4 g/dL (ref 6.0–8.5)

## 2024-08-01 LAB — LIPID PANEL
Chol/HDL Ratio: 1.6 ratio (ref 0.0–5.0)
Cholesterol, Total: 171 mg/dL (ref 100–199)
HDL: 104 mg/dL (ref >39)
LDL Chol Calc (NIH): 57 mg/dL (ref 0–99)
Triglycerides: 46 mg/dL (ref 0–149)
VLDL Cholesterol Cal: 10 mg/dL (ref 5–40)

## 2024-08-01 MED ORDER — ROSUVASTATIN CALCIUM 20 MG PO TABS: 20.0000 mg | ORAL_TABLET | Freq: Every day | ORAL | 3 refills | Status: AC

## 2024-08-30 ENCOUNTER — Encounter: Payer: Self-pay | Admitting: Family Medicine

## 2024-08-31 MED ORDER — TADALAFIL 5 MG PO TABS: ORAL_TABLET | ORAL | 1 refills | Status: AC

## 2024-12-20 ENCOUNTER — Ambulatory Visit: Admitting: Dermatology
